# Patient Record
Sex: Female | Born: 1990 | Race: White | Hispanic: No | Smoking: Former smoker
Health system: Southern US, Community
[De-identification: ages and names within clinical notes are randomized; demographics above are authoritative.]

## PROBLEM LIST (undated history)

## (undated) DIAGNOSIS — R3915 Urgency of urination: Secondary | ICD-10-CM

## (undated) DIAGNOSIS — N840 Polyp of corpus uteri: Secondary | ICD-10-CM

## (undated) DIAGNOSIS — Z87898 Personal history of other specified conditions: Secondary | ICD-10-CM

## (undated) DIAGNOSIS — N898 Other specified noninflammatory disorders of vagina: Secondary | ICD-10-CM

## (undated) DIAGNOSIS — F419 Anxiety disorder, unspecified: Secondary | ICD-10-CM

## (undated) DIAGNOSIS — N83202 Unspecified ovarian cyst, left side: Secondary | ICD-10-CM

## (undated) DIAGNOSIS — N920 Excessive and frequent menstruation with regular cycle: Secondary | ICD-10-CM

## (undated) DIAGNOSIS — D509 Iron deficiency anemia, unspecified: Secondary | ICD-10-CM

## (undated) DIAGNOSIS — R519 Headache, unspecified: Secondary | ICD-10-CM

## (undated) DIAGNOSIS — F32A Depression, unspecified: Secondary | ICD-10-CM

## (undated) DIAGNOSIS — F99 Mental disorder, not otherwise specified: Secondary | ICD-10-CM

## (undated) DIAGNOSIS — F988 Other specified behavioral and emotional disorders with onset usually occurring in childhood and adolescence: Secondary | ICD-10-CM

## (undated) DIAGNOSIS — R454 Irritability and anger: Secondary | ICD-10-CM

## (undated) DIAGNOSIS — N83201 Unspecified ovarian cyst, right side: Secondary | ICD-10-CM

## (undated) DIAGNOSIS — L659 Nonscarring hair loss, unspecified: Secondary | ICD-10-CM

## (undated) DIAGNOSIS — N921 Excessive and frequent menstruation with irregular cycle: Secondary | ICD-10-CM

## (undated) DIAGNOSIS — N926 Irregular menstruation, unspecified: Secondary | ICD-10-CM

## (undated) DIAGNOSIS — N941 Unspecified dyspareunia: Secondary | ICD-10-CM

## (undated) HISTORY — DX: Urgency of urination: R39.15

## (undated) HISTORY — DX: Nonscarring hair loss, unspecified: L65.9

## (undated) HISTORY — DX: Unspecified ovarian cyst, right side: N83.201

## (undated) HISTORY — DX: Unspecified ovarian cyst, left side: N83.202

## (undated) HISTORY — DX: Mental disorder, not otherwise specified: F99

## (undated) HISTORY — PX: CHOLECYSTECTOMY: SHX55

## (undated) HISTORY — DX: Unspecified dyspareunia: N94.10

## (undated) HISTORY — DX: Other specified noninflammatory disorders of vagina: N89.8

## (undated) HISTORY — DX: Irritability and anger: R45.4

## (undated) HISTORY — DX: Irregular menstruation, unspecified: N92.6

## (undated) HISTORY — DX: Excessive and frequent menstruation with regular cycle: N92.0

---
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
## 2012-01-20 DIAGNOSIS — F411 Generalized anxiety disorder: Secondary | ICD-10-CM | POA: Insufficient documentation

## 2013-01-31 ENCOUNTER — Encounter: Payer: Self-pay | Admitting: Women's Health

## 2013-01-31 ENCOUNTER — Encounter (INDEPENDENT_AMBULATORY_CARE_PROVIDER_SITE_OTHER): Payer: Self-pay

## 2013-01-31 ENCOUNTER — Ambulatory Visit (INDEPENDENT_AMBULATORY_CARE_PROVIDER_SITE_OTHER): Payer: 59 | Admitting: Women's Health

## 2013-01-31 VITALS — BP 140/70 | Ht 68.0 in | Wt 297.4 lb

## 2013-01-31 DIAGNOSIS — R102 Pelvic and perineal pain: Secondary | ICD-10-CM

## 2013-01-31 DIAGNOSIS — N898 Other specified noninflammatory disorders of vagina: Secondary | ICD-10-CM

## 2013-01-31 DIAGNOSIS — B9689 Other specified bacterial agents as the cause of diseases classified elsewhere: Secondary | ICD-10-CM

## 2013-01-31 DIAGNOSIS — N760 Acute vaginitis: Secondary | ICD-10-CM

## 2013-01-31 DIAGNOSIS — N949 Unspecified condition associated with female genital organs and menstrual cycle: Secondary | ICD-10-CM

## 2013-01-31 DIAGNOSIS — A499 Bacterial infection, unspecified: Secondary | ICD-10-CM

## 2013-01-31 LAB — POCT WET PREP (WET MOUNT): CLUE CELLS WET PREP WHIFF POC: POSITIVE

## 2013-01-31 MED ORDER — METRONIDAZOLE 500 MG PO TABS: 500.0000 mg | ORAL_TABLET | Freq: Two times a day (BID) | ORAL | Status: DC

## 2013-01-31 NOTE — Progress Notes (Signed)
Patient ID: Kayla Wilkins, female   DOB: 12-14-1990, 23 y.o.   MRN: [id number]   [facility]  Patient name: Kayla Wilkins MRN [id number]  Date of birth: 12-14-1990  CC & HPI:  Kayla Wilkins is a 23 y.o. G0P0 Caucasian female presenting today as a new patient to our office having moved here from [place], for report of green malodorous d/c x 1 wk w/ some vaginal itching. She also has a Mirena that was [place] in Dec 2014 [place] and thinks it may be causing her some problems. She has been having occ sharp pelvic pains, becoming more frequent, w/ lengthening and shortening of IUD strings. She had IUD placed 2/2 menorrhagia. She denies h/o HTN, doesn't have a PCP, but states her bp's have always been elevated when going to doctor. She thinks her last pap was >1557yrs ago and was abnormal but doesn't remember exactly what it was.   Pertinent History Reviewed:  Medical & Surgical Hx:   Past Medical History  Diagnosis Date  . Menorrhagia   . Bilateral ovarian cysts    Past Surgical History  Procedure Laterality Date  . Cholecystectomy     Medications: Reviewed & Updated - see associated section Social History: Reviewed -  reports that she has quit smoking. Her smoking use included Cigarettes. She smoked 0.00 packs per day. She has never used smokeless tobacco.  Objective Findings:  Vitals: BP 140/70  Ht 5\' 8"  (1.727 m)  Wt 297 lb 6.4 oz (134.9 kg)  BMI 45.23 kg/m2  LMP 01/22/2013 BP recheck: 130/80 Physical Examination: General appearance - alert, well appearing, and in no distress Pelvic - VULVA: normal appearing vulva with no masses, tenderness or lesions, VAGINA: vaginal discharge - bloody and malodorous, CERVIX: normal appearing cervix without discharge or lesions, Mirena IUD strings visible, UTERUS: uterus is normal size, shape, consistency and nontender, ADNEXA: normal adnexa in size, nontender and no masses, WET MOUNT done - results: clue  cells  Assessment & Plan:  A:   BV  Pelvic Pain w/ Mirena IUD  Initally elevated bp  Needs pap & physical P:  F/U 1wk for pelvic u/s and pap & physical  Rx metronidazole 500mg  BID x 7d, no etoh  Establish care w/ PCP as well   Marge DuncansBooker, Kimberly Randall CNM, WHNP-BC 01/31/2013 9:43 AM

## 2013-01-31 NOTE — Patient Instructions (Signed)

## 2013-02-08 ENCOUNTER — Encounter: Payer: Self-pay | Admitting: Women's Health

## 2013-02-08 ENCOUNTER — Ambulatory Visit (INDEPENDENT_AMBULATORY_CARE_PROVIDER_SITE_OTHER): Payer: 59 | Admitting: Women's Health

## 2013-02-08 ENCOUNTER — Other Ambulatory Visit: Payer: Self-pay | Admitting: Women's Health

## 2013-02-08 ENCOUNTER — Ambulatory Visit (INDEPENDENT_AMBULATORY_CARE_PROVIDER_SITE_OTHER): Payer: 59

## 2013-02-08 ENCOUNTER — Other Ambulatory Visit: Payer: 59 | Admitting: Women's Health

## 2013-02-08 VITALS — BP 140/70 | Ht 68.0 in | Wt 294.0 lb

## 2013-02-08 DIAGNOSIS — R102 Pelvic and perineal pain: Secondary | ICD-10-CM

## 2013-02-08 DIAGNOSIS — T839XXA Unspecified complication of genitourinary prosthetic device, implant and graft, initial encounter: Secondary | ICD-10-CM

## 2013-02-08 DIAGNOSIS — N920 Excessive and frequent menstruation with regular cycle: Secondary | ICD-10-CM

## 2013-02-08 DIAGNOSIS — N949 Unspecified condition associated with female genital organs and menstrual cycle: Secondary | ICD-10-CM

## 2013-02-08 DIAGNOSIS — T8332XA Displacement of intrauterine contraceptive device, initial encounter: Secondary | ICD-10-CM

## 2013-02-08 DIAGNOSIS — Z8742 Personal history of other diseases of the female genital tract: Secondary | ICD-10-CM | POA: Insufficient documentation

## 2013-02-08 DIAGNOSIS — Z113 Encounter for screening for infections with a predominantly sexual mode of transmission: Secondary | ICD-10-CM

## 2013-02-08 DIAGNOSIS — Z01419 Encounter for gynecological examination (general) (routine) without abnormal findings: Secondary | ICD-10-CM

## 2013-02-08 DIAGNOSIS — T8389XA Other specified complication of genitourinary prosthetic devices, implants and grafts, initial encounter: Secondary | ICD-10-CM | POA: Insufficient documentation

## 2013-02-08 NOTE — Patient Instructions (Signed)
Nothing to eat or drink after midnight the night before you come for your labs  Levonorgestrel intrauterine device (IUD)- Mirena What is this medicine? LEVONORGESTREL IUD (LEE voe nor jes trel) is a contraceptive (birth control) device. The device is placed inside the uterus by a healthcare professional. It is used to prevent pregnancy and can also be used to treat heavy bleeding that occurs during your period. Depending on the device, it can be used for 3 to 5 years. This medicine may be used for other purposes; ask your health care provider or pharmacist if you have questions. COMMON BRAND NAME(S): Gretta Cool What should I tell my health care provider before I take this medicine? They need to know if you have any of these conditions: -abnormal Pap smear -cancer of the breast, uterus, or cervix -diabetes -endometritis -genital or pelvic infection now or in the past -have more than one sexual partner or your partner has more than one partner -heart disease -history of an ectopic or tubal pregnancy -immune system problems -IUD in place -liver disease or tumor -problems with blood clots or take blood-thinners -use intravenous drugs -uterus of unusual shape -vaginal bleeding that has not been explained -an unusual or allergic reaction to levonorgestrel, other hormones, silicone, or polyethylene, medicines, foods, dyes, or preservatives -pregnant or trying to get pregnant -breast-feeding How should I use this medicine? This device is placed inside the uterus by a health care professional. Talk to your pediatrician regarding the use of this medicine in children. Special care may be needed. Overdosage: If you think you have taken too much of this medicine contact a poison control center or emergency room at once. NOTE: This medicine is only for you. Do not share this medicine with others. What if I miss a dose? This does not apply. What may interact with this medicine? Do not take  this medicine with any of the following medications: -amprenavir -bosentan -fosamprenavir This medicine may also interact with the following medications: -aprepitant -barbiturate medicines for inducing sleep or treating seizures -bexarotene -griseofulvin -medicines to treat seizures like carbamazepine, ethotoin, felbamate, oxcarbazepine, phenytoin, topiramate -modafinil -pioglitazone -rifabutin -rifampin -rifapentine -some medicines to treat HIV infection like atazanavir, indinavir, lopinavir, nelfinavir, tipranavir, ritonavir -St. John's wort -warfarin This list may not describe all possible interactions. Give your health care provider a list of all the medicines, herbs, non-prescription drugs, or dietary supplements you use. Also tell them if you smoke, drink alcohol, or use illegal drugs. Some items may interact with your medicine. What should I watch for while using this medicine? Visit your doctor or health care professional for regular check ups. See your doctor if you or your partner has sexual contact with others, becomes HIV positive, or gets a sexual transmitted disease. This product does not protect you against HIV infection (AIDS) or other sexually transmitted diseases. You can check the placement of the IUD yourself by reaching up to the top of your vagina with clean fingers to feel the threads. Do not pull on the threads. It is a good habit to check placement after each menstrual period. Call your doctor right away if you feel more of the IUD than just the threads or if you cannot feel the threads at all. The IUD may come out by itself. You may become pregnant if the device comes out. If you notice that the IUD has come out use a backup birth control method like condoms and call your health care provider. Using tampons will not change the  position of the IUD and are okay to use during your period. What side effects may I notice from receiving this medicine? Side effects that  you should report to your doctor or health care professional as soon as possible: -allergic reactions like skin rash, itching or hives, swelling of the face, lips, or tongue -fever, flu-like symptoms -genital sores -high blood pressure -no menstrual period for 6 weeks during use -pain, swelling, warmth in the leg -pelvic pain or tenderness -severe or sudden headache -signs of pregnancy -stomach cramping -sudden shortness of breath -trouble with balance, talking, or walking -unusual vaginal bleeding, discharge -yellowing of the eyes or skin Side effects that usually do not require medical attention (report to your doctor or health care professional if they continue or are bothersome): -acne -breast pain -change in sex drive or performance -changes in weight -cramping, dizziness, or faintness while the device is being inserted -headache -irregular menstrual bleeding within first 3 to 6 months of use -nausea This list may not describe all possible side effects. Call your doctor for medical advice about side effects. You may report side effects to FDA at 1-800-FDA-1088. Where should I keep my medicine? This does not apply. NOTE: This sheet is a summary. It may not cover all possible information. If you have questions about this medicine, talk to your doctor, pharmacist, or health care provider.  2014, Elsevier/Gold Standard. (2011-02-05 13:54:04)

## 2013-02-08 NOTE — Progress Notes (Signed)
Patient ID: Kayla Wilkins, female   DOB: August 26, 1990, 23 y.o.   MRN: [id number]   [facility]  Patient name: Kayla Wilkins MRN [id number]  Date of birth: August 26, 1990  CC & HPI:  Kayla SeminoleHeather Wahba is a 23 y.o. Caucasian female presenting today for pelvic u/s d/t pelvic pain w/ Mirena IUD, and scheduled for pap & physical.  Today's u/s reveals multiple cysts Rt ovary, and Mirena IUD located in LUS/Cx. She doesn't have any children and feels that she never wants any children. She had Mirena placed d/t menorrhagia. Very lengthy discussion w/ pt about options, including removing Mirena and replacing w/ a new one, which pt is hesitant to do b/c this is the 2nd Mirena she has had that has migrated and had to be removed. Also discussed COCs-BP is elevated again today, and pt reports h/o occ severe migraines w/ seeing spots. I am hesitant to start her on COCs d/t this. Depo- discussed SE of weight gain, pt w/ current BMI of 44.71 and states she gained 80lb weight gain in the past w/ depo. Megace-pt unsure about.  Ablation-pt is hesitant to do anything permanent such as ablation in case she ever changes her mind about children.  She also would like labs, and is not fasting. She would like to leave this Mirena in place to retain contraception, until she decides what to do. Due to very lengthy conversation, I was not able to perform pap & physical. She will come back for fasting labs tomorrow or Friday and reschedule a pap & physical, IUD removal, w/ me at the beginning of next week. If she develops severe abdominal pain prior to procedure she is to call us or go to [facility] to have Mirena removed.   Objective Findings:  Vitals: BP 140/70  Ht 5\' 8"  (1.727 m)  Wt 294 lb (133.358 kg)  BMI 44.71 kg/m2  LMP 01/22/2013  Today's Pelvic U/S: Uterus 7.1 x 4.7 x 3.8 cm, anteverted uterus no myometrial masses noted  Endometrium 7.5 mm, symmetrical, IUD located in LUS/CX (3.3cm from fundus to top of IUD)   Right ovary 4.5 x 3.6 x 3.3 cm, multiple cysts noted largest =2.2cm  Left ovary 2.8 x 2.4 x 1.5 cm, (visualized best transabdominally)  No free fluid noted  Assessment & Plan:  A:   Malpositioned Mirena IUD  H/O menorrhagia  Elevated bp  P:  Fasting labs today or tomorrow (CBC, CMP, TSH, Lipid profile, A1C)   F/U early next week for pap & physical, IUD removal, and further discussion of options for contraception and menorrhagia management  To buy home BP monitor and check few times daily, bring log when she comes back for visit   Marge DuncansBooker, Darryon Bastin Randall CNM, [facility] 02/08/2013 5:17 PM

## 2013-02-09 ENCOUNTER — Other Ambulatory Visit: Payer: 59

## 2013-02-09 DIAGNOSIS — Z13220 Encounter for screening for lipoid disorders: Secondary | ICD-10-CM

## 2013-02-09 DIAGNOSIS — Z131 Encounter for screening for diabetes mellitus: Secondary | ICD-10-CM

## 2013-02-09 DIAGNOSIS — Z1329 Encounter for screening for other suspected endocrine disorder: Secondary | ICD-10-CM

## 2013-02-09 DIAGNOSIS — Z0000 Encounter for general adult medical examination without abnormal findings: Secondary | ICD-10-CM

## 2013-02-09 LAB — CBC
HCT: 41.5 % (ref 36.0–46.0)
Hemoglobin: 14.1 g/dL (ref 12.0–15.0)
MCH: 32.5 pg (ref 26.0–34.0)
MCHC: 34 g/dL (ref 30.0–36.0)
MCV: 95.6 fL (ref 78.0–100.0)
PLATELETS: 383 10*3/uL (ref 150–400)
RBC: 4.34 MIL/uL (ref 3.87–5.11)
RDW: 13.2 % (ref 11.5–15.5)
WBC: 8.6 10*3/uL (ref 4.0–10.5)

## 2013-02-09 LAB — COMPREHENSIVE METABOLIC PANEL
ALBUMIN: 4.1 g/dL (ref 3.5–5.2)
ALK PHOS: 74 U/L (ref 39–117)
ALT: 29 U/L (ref 0–35)
AST: 27 U/L (ref 0–37)
BILIRUBIN TOTAL: 0.4 mg/dL (ref 0.3–1.2)
BUN: 9 mg/dL (ref 6–23)
CO2: 29 mEq/L (ref 19–32)
CREATININE: 0.77 mg/dL (ref 0.50–1.10)
Calcium: 8.8 mg/dL (ref 8.4–10.5)
Chloride: 103 mEq/L (ref 96–112)
GLUCOSE: 94 mg/dL (ref 70–99)
POTASSIUM: 4.1 meq/L (ref 3.5–5.3)
Sodium: 136 mEq/L (ref 135–145)
Total Protein: 6.6 g/dL (ref 6.0–8.3)

## 2013-02-09 LAB — LIPID PANEL
CHOL/HDL RATIO: 2.9 ratio
Cholesterol: 97 mg/dL (ref 0–200)
HDL: 34 mg/dL — AB (ref >39)
LDL CALC: 51 mg/dL (ref 0–99)
TRIGLYCERIDES: 62 mg/dL (ref <150)
VLDL: 12 mg/dL (ref 0–40)

## 2013-02-09 LAB — GC/CHLAMYDIA PROBE AMP
CT PROBE, AMP APTIMA: NEGATIVE
GC Probe RNA: NEGATIVE

## 2013-02-09 LAB — HEMOGLOBIN A1C
Hgb A1c MFr Bld: 5.5 % (ref <5.7)
Mean Plasma Glucose: 111 mg/dL (ref <117)

## 2013-02-10 LAB — TSH: TSH: 1.121 u[IU]/mL (ref 0.350–4.500)

## 2013-02-13 ENCOUNTER — Encounter: Payer: Self-pay | Admitting: Women's Health

## 2013-02-15 ENCOUNTER — Ambulatory Visit (INDEPENDENT_AMBULATORY_CARE_PROVIDER_SITE_OTHER): Payer: 59 | Admitting: Women's Health

## 2013-02-15 ENCOUNTER — Other Ambulatory Visit (HOSPITAL_COMMUNITY)
Admission: RE | Admit: 2013-02-15 | Discharge: 2013-02-15 | Disposition: A | Discharge status: Home / Self Care | Payer: 59 | Source: Ambulatory Visit | Attending: Obstetrics & Gynecology | Admitting: Obstetrics & Gynecology

## 2013-02-15 ENCOUNTER — Encounter: Payer: Self-pay | Admitting: Women's Health

## 2013-02-15 VITALS — BP 122/82 | Ht 68.0 in | Wt 298.0 lb

## 2013-02-15 DIAGNOSIS — Z01419 Encounter for gynecological examination (general) (routine) without abnormal findings: Secondary | ICD-10-CM | POA: Insufficient documentation

## 2013-02-15 DIAGNOSIS — IMO0001 Reserved for inherently not codable concepts without codable children: Secondary | ICD-10-CM

## 2013-02-15 DIAGNOSIS — Z3009 Encounter for other general counseling and advice on contraception: Secondary | ICD-10-CM

## 2013-02-15 DIAGNOSIS — R030 Elevated blood-pressure reading, without diagnosis of hypertension: Secondary | ICD-10-CM

## 2013-02-15 NOTE — Patient Instructions (Signed)
Please take your blood pressure at home 3 times a day and record in log, and bring with you to your appointment  Norethindrone tablets (contraception)- Micronor What is this medicine? NORETHINDRONE (nor eth IN drone) is an oral contraceptive. The product contains a female hormone known as a progestin. It is used to prevent pregnancy. This medicine may be used for other purposes; ask your health care provider or pharmacist if you have questions. COMMON BRAND NAME(S): Camila, Errin , [place], [place], [place] , [place], Nor-QD, Nora-BE, Ortho Micronor What should I tell my health care provider before I take this medicine? They need to know if you have any of these conditions: -blood vessel disease or blood clots -breast, cervical, or vaginal cancer -diabetes -heart disease -kidney disease -liver disease -mental depression -migraine -seizures -stroke -vaginal bleeding -an unusual or allergic reaction to norethindrone, other medicines, foods, dyes, or preservatives -pregnant or trying to get pregnant -breast-feeding How should I use this medicine? Take this medicine by mouth with a glass of water. You may take it with or without food. Follow the directions on the prescription label. Take this medicine at the same time each day and in the order directed on the package. Do not take your medicine more often than directed. Contact your pediatrician regarding the use of this medicine in children. Special care may be needed. This medicine has been used in female children who have started having menstrual periods. A patient package insert for the product will be given with each prescription and refill. Read this sheet carefully each time. The sheet may change frequently. Overdosage: If you think you have taken too much of this medicine contact a poison control center or emergency room at once. NOTE: This medicine is only for you. Do not share this medicine with others. What if I miss a dose? Try  not to miss a dose. Every time you miss a dose or take a dose late your chance of pregnancy increases. When 1 pill is missed (even if only 3 hours late), take the missed pill as soon as possible and continue taking a pill each day at the regular time (use a back up method of birth control for the next 48 hours). If more than 1 dose is missed, use an additional birth control method for the rest of your pill pack until menses occurs. Contact your health care professional if more than 1 dose has been missed. What may interact with this medicine? Do not take this medicine with any of the following medications: -amprenavir or fosamprenavir -bosentan This medicine may also interact with the following medications: -antibiotics or medicines for infections, especially rifampin, rifabutin, rifapentine, and griseofulvin, and possibly penicillins or tetracyclines -aprepitant -barbiturate medicines, such as phenobarbital -carbamazepine -felbamate -modafinil -oxcarbazepine -phenytoin -ritonavir or other medicines for HIV infection or AIDS -St. John's wort -topiramate This list may not describe all possible interactions. Give your health care provider a list of all the medicines, herbs, non-prescription drugs, or dietary supplements you use. Also tell them if you smoke, drink alcohol, or use illegal drugs. Some items may interact with your medicine. What should I watch for while using this medicine? Visit your doctor or health care professional for regular checks on your progress. You will need a regular breast and pelvic exam and Pap smear while on this medicine. Use an additional method of birth control during the first cycle that you take these tablets. If you have any reason to think you are pregnant, stop taking this medicine  right away and contact your doctor or health care professional. If you are taking this medicine for hormone related problems, it may take several cycles of use to see improvement in  your condition. This medicine does not protect you against HIV infection (AIDS) or any other sexually transmitted diseases. What side effects may I notice from receiving this medicine? Side effects that you should report to your doctor or health care professional as soon as possible: -breast tenderness or discharge -pain in the abdomen, chest, groin or leg -severe headache -skin rash, itching, or hives -sudden shortness of breath -unusually weak or tired -vision or speech problems -yellowing of skin or eyes Side effects that usually do not require medical attention (report to your doctor or health care professional if they continue or are bothersome): -changes in sexual desire -change in menstrual flow -facial hair growth -fluid retention and swelling -headache -irritability -nausea -weight gain or loss This list may not describe all possible side effects. Call your doctor for medical advice about side effects. You may report side effects to FDA at 1-800-FDA-1088. Where should I keep my medicine? Keep out of the reach of children. Store at room temperature between 15 and 30 degrees C (59 and 86 degrees F). Throw away any unused medicine after the expiration date. NOTE: This sheet is a summary. It may not cover all possible information. If you have questions about this medicine, talk to your doctor, pharmacist, or health care provider.  2014, Elsevier/Gold Standard. (2011-09-25 16:41:35)

## 2013-02-15 NOTE — Progress Notes (Signed)
Patient ID: Kayla Wilkins, female   DOB: [birth year], 23 y.o.   MRN: [id number] Subjective:     Kayla Wilkins is a 23 y.o. G0P0 Caucasian  female here for a routine well-woman exam.  Patient's last menstrual period was 01/22/2013.  Current complaints: Occ Lt sided pelvic pain and occ lower pelvic pain. Has known malpositioned Mirena IUD that is located in LUS/cx. She is undecided about what she would like to do as far as pulling it out/starting another contraception. We discussed her options last week. Discussed options again today. She is worried about period management b/c she does have a h/o 1-time episode of severe menorrhagia and that's why she had the Mirena placed, which has worked well for her menorrhagia, but this is the 2nd IUD that has become malpositioned, so a 3rd IUD is likely to do the same. She does have a h/o migraines w/ what sound like aura- she sees flashing spots when she has severe migraines- these occur during the ha. She has also had elevated bp's w/ each visit, which she attributes to being nervous. Denies any h/o being dx w/ HTN. States she has a very stressful job and has to work ~50hrs/wk. She does not have a PCP. I do not feel comfortable starting combined contraception options given migraines w/ aura and possible HTN. Projestin-only methods discussed.  She would like to leave the Mirena IUD in for now, schedule an appointment for next week after she has had time to further research her options. She would like printed info on micronor.  Smoking Status: doesn't smoke Got fasting labs including A1C on 02/09/13, which were normal  The following portions of the patient's history were reviewed and updated as appropriate: allergies, current medications, past family history, past medical history, past social history, past surgical history and problem list.   Gynecologic History Patient's last menstrual period was 01/22/2013. Contraception: IUD-Mirena Last Pap: >56yrs ago. Results  were: abnormal Last mammogram: never. Results were: n/a  Obstetric History OB History  No data available    Review of Systems Patient denies any headaches, blurred vision, shortness of breath, chest pain, abdominal pain, problems with bowel movements, urination, or intercourse.      Objective:     Physical Exam  BP 148/60  Ht 5\' 8"  (1.727 m)  Wt 298 lb (135.172 kg)  BMI 45.32 kg/m2  LMP 01/22/2013 BP retake 122/82  General:  Well developed, well nourished, no acute distress. She is alert and oriented x 3. Skin:  Warm and dry Neck:  Midline trachea, no thyromegaly or nodules Cardiovascular: Regular rate and rhythm, no murmur heard Lungs:  Effort normal, all lung fields clear to auscultation bilaterally Breast:  No dominant palpable mass, retraction, or nipple discharge Abdomen:  Soft, non tender, no hepatosplenomegaly or masses Pelvic:  External genitalia is normal in appearance.  The vagina is normal in appearance. The cervix is bulbous, no CMT, Mirena IUD strings are visible. No part of the actual IUD itself is visible.  Thin prep pap is done . Uterus is felt to be normal size, shape, and contour.  No adnexal masses or tenderness noted. Extremities:  No swelling or varicosities noted Psych:  She has a normal mood and affect      Assessment:     Healthy well-woman exam Malpositioned Mirena IUD H/O menorrhagia H/O migraines w/ aura Elevated bp: HTN vs. White coat syndrome Obesity, BMI 45.32 Does not desire pregnancy     Plan:  Gave option of starting antihypertensive  today vs. taking bp's at home and bringing to next appt- she decided to begin taking bp at home 3x/d,   bring log to appt w/ her to determine HTN vs. White coat syndrome Gave her option of seeing neurologist to definitively determine if she has aura w/ migraines, to open up her options to combined contraception She would like to schedule F/U 1wk for anticipated mirena removal and initiation of new  projestin-only contraception Printed info on micronor given per request Mammogram 25yo or sooner if problems Colonoscopy 25yo or sooner if problems   Marge DuncansBooker, Esbeydi Manago Randall CNM, [facility]NP[facility]BC 02/15/2013 3:45 PM

## 2013-02-21 ENCOUNTER — Encounter: Payer: Self-pay | Admitting: Women's Health

## 2013-02-22 ENCOUNTER — Ambulatory Visit: Payer: 59 | Admitting: Women's Health

## 2013-06-02 ENCOUNTER — Ambulatory Visit (INDEPENDENT_AMBULATORY_CARE_PROVIDER_SITE_OTHER): Payer: 59 | Admitting: Obstetrics & Gynecology

## 2013-06-02 ENCOUNTER — Encounter: Payer: Self-pay | Admitting: Obstetrics & Gynecology

## 2013-06-02 VITALS — BP 120/70 | Ht 66.2 in | Wt 290.0 lb

## 2013-06-02 DIAGNOSIS — N760 Acute vaginitis: Secondary | ICD-10-CM

## 2013-06-02 DIAGNOSIS — A499 Bacterial infection, unspecified: Secondary | ICD-10-CM

## 2013-06-02 DIAGNOSIS — B9689 Other specified bacterial agents as the cause of diseases classified elsewhere: Secondary | ICD-10-CM

## 2013-06-02 MED ORDER — METRONIDAZOLE 0.75 % VA GEL: VAGINAL | Status: DC

## 2013-06-02 MED ORDER — DOXYCYCLINE HYCLATE 50 MG PO CAPS: 100.0000 mg | ORAL_CAPSULE | Freq: Two times a day (BID) | ORAL | Status: DC

## 2013-06-02 NOTE — Progress Notes (Signed)
Patient ID: Kayla Wilkins, female   DOB: 1990-05-05, 23 y.o.   MRN: [id number] Chief Complaint  Patient presents with  . GYN VISIT    VAGINAL DISCHARGE.   2 day history of burning itching yellow vaginal discharge Has had BV 1 time before No bleeding No uti sx No GI sx  Exam Wet prep  findings +bv and +heavy WBC infiltrate, no yeast no trichomonas  Imp Mixed vaginitis  Plan Metro gel qhs x 5 Doxycycline 100 mg bid  Past Medical History  Diagnosis Date  . Menorrhagia   . Bilateral ovarian cysts     Past Surgical History  Procedure Laterality Date  . Cholecystectomy      OB History   Grav Para Term Preterm Abortions TAB SAB Ect Mult Living                  No Known Allergies  History   Social History  . Marital Status: Married    Spouse Name: N/A    Number of Children: N/A  . Years of Education: N/A   Social History Main Topics  . Smoking status: Former Smoker    Types: Cigarettes  . Smokeless tobacco: Never Used  . Alcohol Use: Yes     Comment: 3 times a week  . Drug Use: No  . Sexual Activity: Yes    Birth Control/ Protection: IUD   Other Topics Concern  . None   Social History Narrative  . None    Family History  Problem Relation Age of Onset  . Varicose Veins Mother   . Diabetes Paternal Aunt   . Arthritis Maternal Grandmother   . Cancer Maternal Grandfather     lung

## 2013-06-16 ENCOUNTER — Ambulatory Visit: Payer: 59 | Admitting: Obstetrics & Gynecology

## 2013-08-30 ENCOUNTER — Encounter: Payer: Self-pay | Admitting: Obstetrics & Gynecology

## 2013-08-30 ENCOUNTER — Ambulatory Visit (INDEPENDENT_AMBULATORY_CARE_PROVIDER_SITE_OTHER): Payer: 59 | Admitting: Obstetrics & Gynecology

## 2013-08-30 VITALS — BP 120/70 | Wt 282.4 lb

## 2013-08-30 DIAGNOSIS — N949 Unspecified condition associated with female genital organs and menstrual cycle: Secondary | ICD-10-CM

## 2013-08-30 DIAGNOSIS — R102 Pelvic and perineal pain: Secondary | ICD-10-CM

## 2013-08-30 NOTE — Progress Notes (Signed)
Patient ID: Kayla Wilkins, female   DOB: [birth year], 23 y.o.   MRN: [id number] Pt with 2-3 year history of on again off again pelvic discomfort Had IUD that long Improved on a course of doxycycline but improvement lasted about 5-6 weeks  Blood pressure 120/70, weight 282 lb 6.4 oz (128.096 kg), last menstrual period 08/13/2013.  Exam Abdomen soft not abdominal wall pain NEFG Blood in vault vagina without discharge Cervix normal Uterus NSSC no CMT ovries non tender normal size  Impression Pelvic pain secondary to IUD probably  No evidnece of infection  Sonogram and then follow up

## 2013-09-07 ENCOUNTER — Ambulatory Visit (INDEPENDENT_AMBULATORY_CARE_PROVIDER_SITE_OTHER): Payer: 59 | Admitting: Obstetrics & Gynecology

## 2013-09-07 ENCOUNTER — Encounter: Payer: 59 | Admitting: Obstetrics & Gynecology

## 2013-09-07 ENCOUNTER — Other Ambulatory Visit: Payer: Self-pay | Admitting: Obstetrics & Gynecology

## 2013-09-07 ENCOUNTER — Encounter: Payer: Self-pay | Admitting: Obstetrics & Gynecology

## 2013-09-07 ENCOUNTER — Ambulatory Visit (INDEPENDENT_AMBULATORY_CARE_PROVIDER_SITE_OTHER): Payer: 59

## 2013-09-07 VITALS — BP 110/70 | Wt 278.0 lb

## 2013-09-07 DIAGNOSIS — T8332XD Displacement of intrauterine contraceptive device, subsequent encounter: Secondary | ICD-10-CM

## 2013-09-07 DIAGNOSIS — R102 Pelvic and perineal pain: Secondary | ICD-10-CM

## 2013-09-07 DIAGNOSIS — N949 Unspecified condition associated with female genital organs and menstrual cycle: Secondary | ICD-10-CM

## 2013-09-07 DIAGNOSIS — Z5189 Encounter for other specified aftercare: Secondary | ICD-10-CM

## 2013-09-07 DIAGNOSIS — T8332XS Displacement of intrauterine contraceptive device, sequela: Secondary | ICD-10-CM

## 2013-09-07 DIAGNOSIS — T8389XA Other specified complication of genitourinary prosthetic devices, implants and grafts, initial encounter: Secondary | ICD-10-CM

## 2013-09-07 DIAGNOSIS — Z30432 Encounter for removal of intrauterine contraceptive device: Secondary | ICD-10-CM

## 2013-09-07 MED ORDER — DESOGESTREL-ETHINYL ESTRADIOL 0.15-30 MG-MCG PO TABS: 1.0000 | ORAL_TABLET | Freq: Every day | ORAL | Status: DC

## 2013-09-07 NOTE — Progress Notes (Signed)
Patient ID: Kayla SeminoleHeather Wilkins, female   DOB: 1990-08-09, 23 y.o.   MRN: [id number] Pt continues to have irregular bleeding cervicitis/endometritis etc with malpositioned IUD Removed without difficulty  Will start desogestrel OCP  Follow up prn

## 2013-12-13 ENCOUNTER — Telehealth: Payer: Self-pay | Admitting: Obstetrics and Gynecology

## 2013-12-13 NOTE — Telephone Encounter (Signed)
Pt states that she is about 3 weeks into her Rx and spotted two days for about an hour. Pt has had a negative UPT, pt has had really intense cramping and large blood clots. LMP 11/21/13. Pt is taking Reclipsen.   I spoke with Dr. Emelda FearFerguson about above and he advised that it al sounds normal. I advised the pt that if anything changes or gets worse, call our office back and we could get her worked in.   Pt verbalized understanding.

## 2014-02-06 ENCOUNTER — Ambulatory Visit (INDEPENDENT_AMBULATORY_CARE_PROVIDER_SITE_OTHER): Admitting: Obstetrics & Gynecology

## 2014-02-06 ENCOUNTER — Encounter: Payer: Self-pay | Admitting: Obstetrics & Gynecology

## 2014-02-06 VITALS — BP 122/80 | Wt 286.0 lb

## 2014-02-06 DIAGNOSIS — N760 Acute vaginitis: Secondary | ICD-10-CM

## 2014-02-06 DIAGNOSIS — A499 Bacterial infection, unspecified: Secondary | ICD-10-CM

## 2014-02-06 DIAGNOSIS — B9689 Other specified bacterial agents as the cause of diseases classified elsewhere: Secondary | ICD-10-CM

## 2014-02-06 MED ORDER — METRONIDAZOLE 0.75 % VA GEL: VAGINAL | Status: DC

## 2014-02-06 NOTE — Progress Notes (Signed)
Patient ID: Kayla Wilkins, female   DOB: 04-27-1990, 24 y.o.   MRN: [id number] Chief Complaint  Patient presents with  . gyn visit    vaginal discharge/ white and gray in color.    HPI Pt's husband came home from deployment and had sex "7 or 8" times Then began having vaginal discharge  ROS No burning with urination, frequency or urgency No nausea, vomiting or diarrhea Nor fever chills or other constitutional symptoms   Blood pressure 122/80, weight 286 lb (129.729 kg), last menstrual period 01/24/2014.  EXAM Abdomen:       Vulva:            normal appearing vulva with no masses, tenderness or lesions Vagina:          normal mucosa, thin grey discharge Cervix:           normal appearance Uterus:           Adnexa:          Rectal:            Hemoccult:       Wet prep:  + clue cells light WBC no yeast or trich                          Assessment/Plan:  BV: metro gel x 5 days

## 2014-05-09 ENCOUNTER — Encounter: Payer: Self-pay | Admitting: Obstetrics & Gynecology

## 2014-05-09 ENCOUNTER — Other Ambulatory Visit (HOSPITAL_COMMUNITY)
Admission: RE | Admit: 2014-05-09 | Discharge: 2014-05-09 | Disposition: A | Discharge status: Home / Self Care | Source: Ambulatory Visit | Attending: Obstetrics & Gynecology | Admitting: Obstetrics & Gynecology

## 2014-05-09 ENCOUNTER — Ambulatory Visit (INDEPENDENT_AMBULATORY_CARE_PROVIDER_SITE_OTHER): Admitting: Obstetrics & Gynecology

## 2014-05-09 VITALS — BP 120/80 | HR 76 | Ht 68.0 in | Wt 283.0 lb

## 2014-05-09 DIAGNOSIS — N920 Excessive and frequent menstruation with regular cycle: Secondary | ICD-10-CM | POA: Insufficient documentation

## 2014-05-09 DIAGNOSIS — Z79899 Other long term (current) drug therapy: Secondary | ICD-10-CM | POA: Insufficient documentation

## 2014-05-09 DIAGNOSIS — N8320 Unspecified ovarian cysts: Secondary | ICD-10-CM | POA: Insufficient documentation

## 2014-05-09 DIAGNOSIS — Z01419 Encounter for gynecological examination (general) (routine) without abnormal findings: Secondary | ICD-10-CM | POA: Diagnosis not present

## 2014-05-09 DIAGNOSIS — Z87891 Personal history of nicotine dependence: Secondary | ICD-10-CM | POA: Diagnosis not present

## 2014-05-09 MED ORDER — DESOGESTREL-ETHINYL ESTRADIOL 0.15-30 MG-MCG PO TABS: 1.0000 | ORAL_TABLET | Freq: Every day | ORAL | Status: DC

## 2014-05-09 NOTE — Progress Notes (Signed)
Patient ID: Kayla Wilkins, female   DOB: 04-28-1990, 24 y.o.   MRN: [id number] Subjective:     Kayla Wilkins is a 24 y.o. female here for a routine exam.  Patient's last menstrual period was 04/15/2014. No obstetric history on file. Birth Control Method:  OCP regular Menstrual Calendar(currently): regular  Current complaints: none.   Current acute medical issues:  none   Recent Gynecologic History Patient's last menstrual period was 04/15/2014. Last Pap: 2015 ,  normal Last mammogram: ,    Past Medical History  Diagnosis Date  . Menorrhagia   . Bilateral ovarian cysts     Past Surgical History  Procedure Laterality Date  . Cholecystectomy      OB History    No data available      History   Social History  . Marital Status: Married    Spouse Name: N/A  . Number of Children: N/A  . Years of Education: N/A   Social History Main Topics  . Smoking status: Former Smoker    Types: Cigarettes  . Smokeless tobacco: Never Used  . Alcohol Use: Yes     Comment: 3 times a week  . Drug Use: No  . Sexual Activity: Yes    Birth Control/ Protection: IUD   Other Topics Concern  . None   Social History Narrative    Family History  Problem Relation Age of Onset  . Varicose Veins Mother   . Diabetes Paternal Aunt   . Arthritis Maternal Grandmother   . Cancer Maternal Grandfather     lung     Current outpatient prescriptions:  .  desogestrel-ethinyl estradiol (APRI,EMOQUETTE,SOLIA) 0.15-30 MG-MCG tablet, Take 1 tablet by mouth daily., Disp: 1 Package, Rfl: 11 .  sertraline (ZOLOFT) 50 MG tablet, Take 50 mg by mouth daily., Disp: , Rfl:  .  doxycycline (VIBRAMYCIN) 50 MG capsule, Take 2 capsules (100 mg total) by mouth 2 (two) times daily. (Patient not taking: Reported on 02/06/2014), Disp: 20 capsule, Rfl: 0 .  ibuprofen (ADVIL,MOTRIN) 400 MG tablet, Take 400 mg by mouth every 6 (six) hours as needed., Disp: , Rfl:  .  levonorgestrel (MIRENA) 20 MCG/24HR IUD, 1 each  by Intrauterine route once., Disp: , Rfl:  .  metroNIDAZOLE (FLAGYL) 500 MG tablet, Take 1 tablet (500 mg total) by mouth 2 (two) times daily. X 7 days (Patient not taking: Reported on 02/06/2014), Disp: 14 tablet, Rfl: 0 .  metroNIDAZOLE (METROGEL VAGINAL) 0.75 % vaginal gel, Nightly x 5 nights, Disp: 70 g, Rfl: 0 .  prazosin (MINIPRESS) 5 MG capsule, Take 5 mg by mouth at bedtime., Disp: , Rfl:   Review of Systems  Review of Systems  Constitutional: Negative for fever, chills, weight loss, malaise/fatigue and diaphoresis.  HENT: Negative for hearing loss, ear pain, nosebleeds, congestion, sore throat, neck pain, tinnitus and ear discharge.   Eyes: Negative for blurred vision, double vision, photophobia, pain, discharge and redness.  Respiratory: Negative for cough, hemoptysis, sputum production, shortness of breath, wheezing and stridor.   Cardiovascular: Negative for chest pain, palpitations, orthopnea, claudication, leg swelling and PND.  Gastrointestinal: negative for abdominal pain. Negative for heartburn, nausea, vomiting, diarrhea, constipation, blood in stool and melena.  Genitourinary: Negative for dysuria, urgency, frequency, hematuria and flank pain.  Musculoskeletal: Negative for myalgias, back pain, joint pain and falls.  Skin: Negative for itching and rash.  Neurological: Negative for dizziness, tingling, tremors, sensory change, speech change, focal weakness, seizures, loss of consciousness, weakness and headaches.  Endo/Heme/Allergies:  Negative for environmental allergies and polydipsia. Does not bruise/bleed easily.  Psychiatric/Behavioral: Negative for depression, suicidal ideas, hallucinations, memory loss and substance abuse. The patient is not nervous/anxious and does not have insomnia.        Objective:  Blood pressure 120/80, pulse 76, height 5\' 8"  (1.727 m), weight 283 lb (128.368 kg), last menstrual period 04/15/2014.   Physical Exam  Vitals  reviewed. Constitutional: She is oriented to person, place, and time. She appears well-developed and well-nourished.  HENT:  Head: Normocephalic and atraumatic.        Right Ear: External ear normal.  Left Ear: External ear normal.  Nose: Nose normal.  Mouth/Throat: Oropharynx is clear and moist.  Eyes: Conjunctivae and EOM are normal. Pupils are equal, round, and reactive to light. Right eye exhibits no discharge. Left eye exhibits no discharge. No scleral icterus.  Neck: Normal range of motion. Neck supple. No tracheal deviation present. No thyromegaly present.  Cardiovascular: Normal rate, regular rhythm, normal heart sounds and intact distal pulses.  Exam reveals no gallop and no friction rub.   No murmur heard. Respiratory: Effort normal and breath sounds normal. No respiratory distress. She has no wheezes. She has no rales. She exhibits no tenderness.  GI: Soft. Bowel sounds are normal. She exhibits no distension and no mass. There is no tenderness. There is no rebound and no guarding.  Genitourinary:  Breasts no masses skin changes or nipple changes bilaterally      Vulva is normal without lesions Vagina is pink moist without discharge Cervix normal in appearance and pap is done Uterus is normal size shape and contour Adnexa is negative with normal sized ovaries   Musculoskeletal: Normal range of motion. She exhibits no edema and no tenderness.  Neurological: She is alert and oriented to person, place, and time. She has normal reflexes. She displays normal reflexes. No cranial nerve deficit. She exhibits normal muscle tone. Coordination normal.  Skin: Skin is warm and dry. No rash noted. No erythema. No pallor.  Psychiatric: She has a normal mood and affect. Her behavior is normal. Judgment and thought content normal.       Assessment:    Healthy female exam.    Plan:    Contraception: OCP (estrogen/progesterone). Follow up in: 1 years.

## 2014-05-10 ENCOUNTER — Other Ambulatory Visit: Admitting: Obstetrics & Gynecology

## 2014-05-11 LAB — CYTOLOGY - PAP

## 2014-06-26 DIAGNOSIS — G8929 Other chronic pain: Secondary | ICD-10-CM | POA: Insufficient documentation

## 2014-06-26 DIAGNOSIS — M545 Low back pain, unspecified: Secondary | ICD-10-CM | POA: Insufficient documentation

## 2014-06-26 DIAGNOSIS — M25561 Pain in right knee: Secondary | ICD-10-CM | POA: Insufficient documentation

## 2014-07-16 ENCOUNTER — Telehealth: Payer: Self-pay | Admitting: Obstetrics & Gynecology

## 2014-07-16 DIAGNOSIS — Z1329 Encounter for screening for other suspected endocrine disorder: Secondary | ICD-10-CM

## 2014-07-16 NOTE — Telephone Encounter (Signed)
Pt requesting when she last had her last blood work done for thyroid and results. Pt informed TSH 1.121 on 02/09/2013.   Pt requesting to have blood work done to check her Thyroid. Order placed and pt informed [facility] located across the hall and hours of operation.

## 2014-09-01 ENCOUNTER — Other Ambulatory Visit: Payer: Self-pay | Admitting: Obstetrics & Gynecology

## 2014-10-08 ENCOUNTER — Other Ambulatory Visit: Payer: Self-pay | Admitting: Obstetrics & Gynecology

## 2014-10-08 ENCOUNTER — Telehealth: Payer: Self-pay | Admitting: Obstetrics & Gynecology

## 2014-10-08 NOTE — Telephone Encounter (Signed)
Pt taking Juleber for OCP, would like to change her birth control pill to something else due to "no sex drive." Please advise.

## 2014-10-09 ENCOUNTER — Telehealth: Payer: Self-pay | Admitting: Obstetrics & Gynecology

## 2014-10-09 MED ORDER — NORGESTIMATE-ETH ESTRADIOL 0.25-35 MG-MCG PO TABS: 1.0000 | ORAL_TABLET | Freq: Every day | ORAL | Status: DC

## 2014-10-12 ENCOUNTER — Telehealth: Payer: Self-pay | Admitting: Obstetrics & Gynecology

## 2014-10-18 NOTE — Telephone Encounter (Signed)
Pt informed Dr. Wilmer Floor Sprintec on 10/09/2014, Pt c/o an abnormal odor with her period but not on her period now. Pt advised to call when symptoms are noted and will try to work the pt in with a provider. All questions in regards to new birth control pill answered and pt verbalized understanding.

## 2015-01-14 DIAGNOSIS — Z903 Acquired absence of stomach [part of]: Secondary | ICD-10-CM

## 2015-01-14 HISTORY — DX: Acquired absence of stomach (part of): Z90.3

## 2015-01-14 HISTORY — PX: LAPAROSCOPIC GASTRIC SLEEVE RESECTION: SHX5895

## 2015-01-14 HISTORY — PX: OTHER SURGICAL HISTORY: SHX169

## 2015-01-20 DIAGNOSIS — G935 Compression of brain: Secondary | ICD-10-CM

## 2015-01-20 HISTORY — DX: Compression of brain: G93.5

## 2015-02-21 DIAGNOSIS — Z9884 Bariatric surgery status: Secondary | ICD-10-CM | POA: Insufficient documentation

## 2015-05-13 ENCOUNTER — Other Ambulatory Visit: Admitting: Obstetrics & Gynecology

## 2015-05-13 ENCOUNTER — Encounter: Payer: Self-pay | Admitting: Obstetrics & Gynecology

## 2015-07-29 DIAGNOSIS — M5136 Other intervertebral disc degeneration, lumbar region: Secondary | ICD-10-CM | POA: Insufficient documentation

## 2015-07-29 DIAGNOSIS — M5144 Schmorl's nodes, thoracic region: Secondary | ICD-10-CM | POA: Insufficient documentation

## 2015-08-15 ENCOUNTER — Ambulatory Visit: Admitting: Adult Health

## 2015-08-21 ENCOUNTER — Ambulatory Visit (INDEPENDENT_AMBULATORY_CARE_PROVIDER_SITE_OTHER): Admitting: Adult Health

## 2015-08-21 ENCOUNTER — Encounter: Payer: Self-pay | Admitting: Adult Health

## 2015-08-21 VITALS — BP 108/72 | HR 76 | Ht 68.0 in | Wt 180.5 lb

## 2015-08-21 DIAGNOSIS — N926 Irregular menstruation, unspecified: Secondary | ICD-10-CM

## 2015-08-21 DIAGNOSIS — N898 Other specified noninflammatory disorders of vagina: Secondary | ICD-10-CM | POA: Diagnosis not present

## 2015-08-21 DIAGNOSIS — R454 Irritability and anger: Secondary | ICD-10-CM | POA: Diagnosis not present

## 2015-08-21 DIAGNOSIS — N941 Unspecified dyspareunia: Secondary | ICD-10-CM

## 2015-08-21 DIAGNOSIS — Z3202 Encounter for pregnancy test, result negative: Secondary | ICD-10-CM | POA: Diagnosis not present

## 2015-08-21 DIAGNOSIS — R3915 Urgency of urination: Secondary | ICD-10-CM | POA: Diagnosis not present

## 2015-08-21 DIAGNOSIS — L659 Nonscarring hair loss, unspecified: Secondary | ICD-10-CM | POA: Diagnosis not present

## 2015-08-21 HISTORY — DX: Irritability and anger: R45.4

## 2015-08-21 HISTORY — DX: Nonscarring hair loss, unspecified: L65.9

## 2015-08-21 HISTORY — DX: Unspecified dyspareunia: N94.10

## 2015-08-21 HISTORY — DX: Irregular menstruation, unspecified: N92.6

## 2015-08-21 HISTORY — DX: Urgency of urination: R39.15

## 2015-08-21 HISTORY — DX: Other specified noninflammatory disorders of vagina: N89.8

## 2015-08-21 LAB — POCT URINALYSIS DIPSTICK
GLUCOSE UA: NEGATIVE
Leukocytes, UA: NEGATIVE
Nitrite, UA: NEGATIVE
Protein, UA: NEGATIVE

## 2015-08-21 LAB — POCT WET PREP (WET MOUNT): CLUE CELLS WET PREP WHIFF POC: NEGATIVE

## 2015-08-21 LAB — POCT URINE PREGNANCY: Preg Test, Ur: NEGATIVE

## 2015-08-21 NOTE — Patient Instructions (Addendum)
Keep period calendar,if wants OCs let us know  Follow up prn  See dermatologist  Talk with psychiatrist

## 2015-08-21 NOTE — Progress Notes (Signed)
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   Review of Systems Patient denies any daily headaches, hearing loss, fatigue, blurred vision, shortness of breath, chest pain, abdominal pain, problems with bowel movements.  No joint pain or mood swings.She HPI for positives.  Reviewed past medical,surgical, social and family history. Reviewed medications and allergies.     Objective:   Physical Exam BP 108/72 (BP Location: Left Arm, Patient Position: Sitting, Cuff Size: Normal)   Pulse 76   Ht 5\' 8"  (1.727 m)   Wt 180 lb 8 oz (81.9 kg)   LMP 08/21/2015   BMI 27.44 kg/m urine 3+ blood UPT negative, Skin warm and dry, eye brows are thin, Pelvic: external genitalia is normal in appearance no lesions, vagina: period like blood,urethra has no lesions or masses noted, cervix:smooth and nontender, uterus: normal size, shape and contour, non tender, no masses felt, adnexa: no masses or tenderness noted. Bladder is non tender and no masses felt. Wet prep: +RBC GC/CHL obtained.    TSH 1.43 and free T4 1.37 in June. Discussed seeing dermatologist for hair, try washing eye brows with baby shampoo, and talk with Dr Mila Homer about irritability and keep period calendar, can always try low dose OCs.Make sure getting protein and fats in diet.Make sure getting enough water.Change positions with sex and use  lubricate and have sex regularly.Told her, her body and mind needs to adjust to her weight loss.Exercise regularly. Face time 25 minutes with 50% counseling.  Assessment:    Vaginal discharge Urinary frequency UPT negative Irregular periods Dyspareunia Hair loss Irritable       Plan:     GC/CHL sent  UA C&S sent  Keep period calendar, let me know if wants to try OCs  See dermatologist about hair loss Talk with Dr Mila Homer about irritability along with her depression Follow up prn

## 2015-08-22 LAB — GC/CHLAMYDIA PROBE AMP
CHLAMYDIA, DNA PROBE: NEGATIVE
NEISSERIA GONORRHOEAE BY PCR: NEGATIVE

## 2015-08-22 LAB — URINALYSIS, ROUTINE W REFLEX MICROSCOPIC
Bilirubin, UA: NEGATIVE
Glucose, UA: NEGATIVE
Ketones, UA: NEGATIVE
Leukocytes, UA: NEGATIVE
Nitrite, UA: NEGATIVE
Specific Gravity, UA: 1.029 (ref 1.005–1.030)
Urobilinogen, Ur: 0.2 mg/dL (ref 0.2–1.0)
pH, UA: 6 (ref 5.0–7.5)

## 2015-08-22 LAB — MICROSCOPIC EXAMINATION

## 2015-08-23 LAB — URINE CULTURE

## 2015-08-26 ENCOUNTER — Telehealth: Payer: Self-pay | Admitting: Adult Health

## 2015-08-26 MED ORDER — NITROFURANTOIN MONOHYD MACRO 100 MG PO CAPS: 100.0000 mg | ORAL_CAPSULE | Freq: Two times a day (BID) | ORAL | 0 refills | Status: DC

## 2015-08-26 NOTE — Telephone Encounter (Signed)
Left message that urine + Ecoli and that I will send rx for macrobid to [facility], push fluids

## 2015-12-31 DIAGNOSIS — F3342 Major depressive disorder, recurrent, in full remission: Secondary | ICD-10-CM | POA: Insufficient documentation

## 2017-07-26 DIAGNOSIS — M222X2 Patellofemoral disorders, left knee: Secondary | ICD-10-CM | POA: Insufficient documentation

## 2017-07-26 DIAGNOSIS — R29898 Other symptoms and signs involving the musculoskeletal system: Secondary | ICD-10-CM | POA: Insufficient documentation

## 2017-07-26 DIAGNOSIS — Z9884 Bariatric surgery status: Secondary | ICD-10-CM | POA: Insufficient documentation

## 2017-07-26 DIAGNOSIS — M7652 Patellar tendinitis, left knee: Secondary | ICD-10-CM | POA: Insufficient documentation

## 2017-07-26 DIAGNOSIS — M7651 Patellar tendinitis, right knee: Secondary | ICD-10-CM | POA: Insufficient documentation

## 2017-08-16 DIAGNOSIS — M25552 Pain in left hip: Secondary | ICD-10-CM | POA: Insufficient documentation

## 2017-08-23 ENCOUNTER — Ambulatory Visit: Admitting: Obstetrics & Gynecology

## 2017-09-27 ENCOUNTER — Other Ambulatory Visit: Admitting: Obstetrics & Gynecology

## 2018-04-03 DIAGNOSIS — F09 Unspecified mental disorder due to known physiological condition: Secondary | ICD-10-CM | POA: Insufficient documentation

## 2018-04-03 DIAGNOSIS — I499 Cardiac arrhythmia, unspecified: Secondary | ICD-10-CM | POA: Insufficient documentation

## 2018-04-03 DIAGNOSIS — G629 Polyneuropathy, unspecified: Secondary | ICD-10-CM | POA: Insufficient documentation

## 2018-04-03 DIAGNOSIS — R32 Unspecified urinary incontinence: Secondary | ICD-10-CM | POA: Insufficient documentation

## 2018-06-03 DIAGNOSIS — R2681 Unsteadiness on feet: Secondary | ICD-10-CM | POA: Insufficient documentation

## 2018-06-03 DIAGNOSIS — M62838 Other muscle spasm: Secondary | ICD-10-CM | POA: Insufficient documentation

## 2018-06-03 DIAGNOSIS — R200 Anesthesia of skin: Secondary | ICD-10-CM | POA: Insufficient documentation

## 2018-07-04 DIAGNOSIS — L409 Psoriasis, unspecified: Secondary | ICD-10-CM | POA: Insufficient documentation

## 2018-07-04 DIAGNOSIS — M549 Dorsalgia, unspecified: Secondary | ICD-10-CM | POA: Insufficient documentation

## 2018-08-03 DIAGNOSIS — L853 Xerosis cutis: Secondary | ICD-10-CM | POA: Insufficient documentation

## 2018-09-07 ENCOUNTER — Ambulatory Visit: Admitting: Family Medicine

## 2018-09-09 ENCOUNTER — Telehealth: Payer: Self-pay

## 2018-09-09 ENCOUNTER — Ambulatory Visit: Admitting: Family Medicine

## 2018-09-09 NOTE — Telephone Encounter (Signed)

## 2018-09-12 ENCOUNTER — Encounter: Payer: Self-pay | Admitting: Family Medicine

## 2018-09-12 ENCOUNTER — Ambulatory Visit: Payer: 59 | Admitting: Family Medicine

## 2018-09-12 ENCOUNTER — Other Ambulatory Visit: Payer: Self-pay

## 2018-09-12 ENCOUNTER — Ambulatory Visit (INDEPENDENT_AMBULATORY_CARE_PROVIDER_SITE_OTHER): Payer: 59

## 2018-09-12 VITALS — BP 110/70 | HR 64 | Ht 68.0 in | Wt 203.0 lb

## 2018-09-12 DIAGNOSIS — M791 Myalgia, unspecified site: Secondary | ICD-10-CM

## 2018-09-12 DIAGNOSIS — M2550 Pain in unspecified joint: Secondary | ICD-10-CM

## 2018-09-12 DIAGNOSIS — M542 Cervicalgia: Secondary | ICD-10-CM

## 2018-09-12 LAB — CBC
HCT: 39.6 % (ref 36.0–46.0)
Hemoglobin: 13.4 g/dL (ref 12.0–15.0)
MCHC: 33.8 g/dL (ref 30.0–36.0)
MCV: 99.5 fl (ref 78.0–100.0)
Platelets: 325 10*3/uL (ref 150.0–400.0)
RBC: 3.98 Mil/uL (ref 3.87–5.11)
RDW: 12.9 % (ref 11.5–15.5)
WBC: 7.7 10*3/uL (ref 4.0–10.5)

## 2018-09-12 LAB — SEDIMENTATION RATE: Sed Rate: 2 mm/hr (ref 0–20)

## 2018-09-12 NOTE — Progress Notes (Addendum)
Established Patient Office Visit  Subjective:  Patient ID: Kayla Wilkins, female    DOB: June 24, 1990  Age: 28 y.o. MRN: [id number]  CC:  Chief Complaint  Patient presents with  . Establish Care    HPI Kayla SeminoleHeather Cocke presents for evaluation of a 2656-month history, since March, history of multiple arthralgias and myalgias.  It seemed to start with pain and stiffness in her neck.  She occasionally has numbness and tingling in her right arm and fingers.  She reports pain and stiffness in her joints and muscles.  She does not correlate this with any time of day.  She has rare headaches that have been nonprogressive.  She denies diplopia.  She describes occasional lightheadedness without a spinning sensation.  There have been no fever chills or weight loss.  There have been no injuries.  She had experienced a brief episode of paresthesias in the left side of her face that resolved spontaneously.  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  She lives alone with her dog.  She is divorced and does not have a significant other at this time.  Her mom has multiple medical issues.  Her father's health history is unknown.  She does not smoke drink alcohol or use illicit drugs.  History of ADHD.  She is more apt to use her Adderall when she is working.  She takes diazepam daily but is more apt to use the 3 pills again when she is working.  She admits to being stressed with COVID like everyone else and laments not being able to exercise like she used to.  She is also experienced some urinary incontinence.   Past Medical History:  Diagnosis Date  . Bilateral ovarian cysts   . Dyspareunia in female 08/21/2015  . Hair loss 08/21/2015  . Irregular periods 08/21/2015  . Irritable 08/21/2015  . Menorrhagia   . Mental disorder   . Urinary urgency 08/21/2015  . Vaginal discharge 08/21/2015    Past Surgical History:  Procedure Laterality Date  . CHOLECYSTECTOMY    . vertical sleeve   01/14/2015    Family History  Problem  Relation Age of Onset  . Varicose Veins Mother   . Hypertension Mother   . Depression Mother   . Anxiety disorder Mother   . Diabetes Paternal Aunt   . Arthritis Maternal Grandmother   . Alzheimer's disease Maternal Grandmother   . Stroke Maternal Grandmother   . Cancer Maternal Grandfather        lung  . Schizophrenia Maternal Grandfather   . Schizophrenia Father   . Multiple sclerosis Maternal Aunt   . Colon cancer Maternal Aunt     Social History   Socioeconomic History  . Marital status: Divorced    Spouse name: Not on file  . Number of children: Not on file  . Years of education: Not on file  . Highest education level: Not on file  Occupational History  . Not on file  Social Needs  . Financial resource strain: Not on file  . Food insecurity    Worry: Not on file    Inability: Not on file  . Transportation needs    Medical: Not on file    Non-medical: Not on file  Tobacco Use  . Smoking status: Former Smoker    Types: Cigarettes  . Smokeless tobacco: Never Used  Substance and Sexual Activity  . Alcohol use: No  . Drug use: No  . Sexual activity: Yes    Birth control/protection: Surgical  Comment: husband had vasectomy  Lifestyle  . Physical activity    Days per week: Not on file    Minutes per session: Not on file  . Stress: Not on file  Relationships  . Social Herbalist on phone: Not on file    Gets together: Not on file    Attends religious service: Not on file    Active member of club or organization: Not on file    Attends meetings of clubs or organizations: Not on file    Relationship status: Not on file  . Intimate partner violence    Fear of current or ex partner: Not on file    Emotionally abused: Not on file    Physically abused: Not on file    Forced sexual activity: Not on file  Other Topics Concern  . Not on file  Social History Narrative  . Not on file    Outpatient Medications Prior to Visit  Medication Sig Dispense  Refill  . amphetamine-dextroamphetamine (ADDERALL) 15 MG tablet Take 15 mg by mouth 3 (three) times daily.    . diazepam (VALIUM) 5 MG tablet Take 1 tablet by mouth 3 (three) times daily.    Marland Kitchen acetaminophen (TYLENOL) 325 MG tablet Take 650 mg by mouth as needed.    Marland Kitchen amphetamine-dextroamphetamine (ADDERALL) 30 MG tablet 30 mg 2 (two) times daily.     Marland Kitchen buPROPion (WELLBUTRIN SR) 100 MG 12 hr tablet Take 100 mg by mouth 2 (two) times daily.     . calcium citrate-vitamin D (CITRACAL+D) 315-200 MG-UNIT tablet Take 1 tablet by mouth 2 (two) times daily.     . diazepam (VALIUM) 5 MG tablet 15 mg a day as needed.    . fexofenadine (ALLEGRA) 180 MG tablet Take 180 mg by mouth daily.    . nitrofurantoin, macrocrystal-monohydrate, (MACROBID) 100 MG capsule Take 1 capsule (100 mg total) by mouth 2 (two) times daily. 14 capsule 0   No facility-administered medications prior to visit.     No Known Allergies  ROS Review of Systems  Constitutional: Positive for fatigue. Negative for chills, diaphoresis, fever and unexpected weight change.  HENT: Negative.   Eyes: Negative for photophobia and visual disturbance.  Respiratory: Negative.   Cardiovascular: Negative.   Gastrointestinal: Negative.   Endocrine: Negative for polyphagia and polyuria.  Genitourinary: Negative for decreased urine volume, difficulty urinating and dysuria.  Musculoskeletal: Positive for arthralgias, back pain, joint swelling, myalgias, neck pain and neck stiffness.  Skin: Negative for pallor and rash.  Allergic/Immunologic: Negative for immunocompromised state.  Neurological: Positive for light-headedness. Negative for dizziness, seizures, speech difficulty, weakness, numbness and headaches.  Hematological: Does not bruise/bleed easily.      Objective:    Physical Exam  Constitutional: She is oriented to person, place, and time. She appears well-developed and well-nourished. No distress.  HENT:  Head: Normocephalic and  atraumatic.  Right Ear: External ear normal.  Left Ear: External ear normal.  Nose: Nose normal.  Mouth/Throat: Oropharynx is clear and moist. No oropharyngeal exudate.  Eyes: Pupils are equal, round, and reactive to light. Conjunctivae are normal. Right eye exhibits no discharge. Left eye exhibits no discharge. No scleral icterus.  Neck: Neck supple. No JVD present. No tracheal deviation present. No thyromegaly present.  Cardiovascular: Normal rate, regular rhythm and normal heart sounds.  Pulmonary/Chest: Effort normal and breath sounds normal. No stridor.  Abdominal: Bowel sounds are normal.  Musculoskeletal:     Right shoulder: She exhibits normal  range of motion, no tenderness, no bony tenderness, no deformity, no laceration and no pain.     Cervical back: She exhibits bony tenderness. She exhibits normal range of motion, no tenderness, no swelling, no deformity and no spasm.     Thoracic back: She exhibits normal range of motion, no tenderness and no bony tenderness.     Lumbar back: She exhibits normal range of motion, no tenderness and no bony tenderness.       Back:  Lymphadenopathy:    She has no cervical adenopathy.  Neurological: She is alert and oriented to person, place, and time. She has normal strength.  Reflex Scores:      Tricep reflexes are 1+ on the right side and 1+ on the left side.      Bicep reflexes are 2+ on the right side and 2+ on the left side.      Brachioradialis reflexes are 2+ on the right side and 2+ on the left side.      Patellar reflexes are 1+ on the right side and 1+ on the left side.      Achilles reflexes are 1+ on the right side and 1+ on the left side. Negative slr.   Skin: She is not diaphoretic.    BP 110/70   Pulse 64   Ht 5\' 8"  (1.727 m)   Wt 203 lb (92.1 kg)   SpO2 96%   BMI 30.87 kg/m  Wt Readings from Last 3 Encounters:  09/12/18 203 lb (92.1 kg)  08/21/15 180 lb 8 oz (81.9 kg)  05/09/14 283 lb (128.4 kg)   BP Readings from  Last 3 Encounters:  09/12/18 110/70  08/21/15 108/72  05/09/14 120/80   Guideline developer:  UpToDate (see UpToDate for funding source) Date Released: June 2014  Health Maintenance Due  Topic Date Due  . HIV Screening  07/03/2003  . PAP SMEAR-Modifier  05/08/2017  . INFLUENZA VACCINE  08/20/2018    There are no preventive care reminders to display for this patient.  Lab Results  Component Value Date   TSH 1.121 02/09/2013   Lab Results  Component Value Date   WBC 7.7 09/12/2018   HGB 13.4 09/12/2018   HCT 39.6 09/12/2018   MCV 99.5 09/12/2018   PLT 325.0 09/12/2018   Lab Results  Component Value Date   NA 139 09/12/2018   K 4.1 09/12/2018   CO2 25 09/12/2018   GLUCOSE 95 09/12/2018   BUN 14 09/12/2018   CREATININE 0.69 09/12/2018   BILITOT 0.4 09/12/2018   ALKPHOS 42 09/12/2018   AST 19 09/12/2018   ALT 16 09/12/2018   PROT 6.5 09/12/2018   ALBUMIN 4.3 09/12/2018   CALCIUM 9.0 09/12/2018   GFR 99.76 09/12/2018   Lab Results  Component Value Date   CHOL 97 02/09/2013   Lab Results  Component Value Date   HDL 34 (L) 02/09/2013   Lab Results  Component Value Date   LDLCALC 51 02/09/2013   Lab Results  Component Value Date   TRIG 62 02/09/2013   Lab Results  Component Value Date   CHOLHDL 2.9 02/09/2013   Lab Results  Component Value Date   HGBA1C 5.5 02/09/2013      Assessment & Plan:   Problem List Items Addressed This Visit    None    Visit Diagnoses    Myalgia    -  Primary   Relevant Orders   CBC (Completed)   Comprehensive metabolic panel (Completed)  Sedimentation rate (Completed)   C-reactive protein (Completed)   Rheumatoid Factor (Completed)   Urinalysis, Routine w reflex microscopic (Completed)   Ambulatory referral to Sports Medicine   Arthralgia, unspecified joint       Relevant Orders   CBC (Completed)   Comprehensive metabolic panel (Completed)   Sedimentation rate (Completed)   C-reactive protein (Completed)    Rheumatoid Factor (Completed)   Urinalysis, Routine w reflex microscopic (Completed)   Ambulatory referral to Sports Medicine   Cervical pain       Relevant Orders   DG Cervical Spine Complete (Completed)   Rheumatoid Factor (Completed)   Ambulatory referral to Sports Medicine      No orders of the defined types were placed in this encounter.   Follow-up: Return in about 1 month (around 10/13/2018).   8/25 addendum: No evidence for inflammatory arthritis.

## 2018-09-13 LAB — URINALYSIS, ROUTINE W REFLEX MICROSCOPIC
Bilirubin Urine: NEGATIVE
Hgb urine dipstick: NEGATIVE
Ketones, ur: NEGATIVE
Leukocytes,Ua: NEGATIVE
Nitrite: NEGATIVE
RBC / HPF: NONE SEEN (ref 0–?)
Specific Gravity, Urine: 1.025 (ref 1.000–1.030)
Total Protein, Urine: NEGATIVE
Urine Glucose: NEGATIVE
Urobilinogen, UA: 0.2 (ref 0.0–1.0)
pH: 6.5 (ref 5.0–8.0)

## 2018-09-13 LAB — COMPREHENSIVE METABOLIC PANEL
ALT: 16 U/L (ref 0–35)
AST: 19 U/L (ref 0–37)
Albumin: 4.3 g/dL (ref 3.5–5.2)
Alkaline Phosphatase: 42 U/L (ref 39–117)
BUN: 14 mg/dL (ref 6–23)
CO2: 25 mEq/L (ref 19–32)
Calcium: 9 mg/dL (ref 8.4–10.5)
Chloride: 107 mEq/L (ref 96–112)
Creatinine, Ser: 0.69 mg/dL (ref 0.40–1.20)
GFR: 99.76 mL/min (ref >60.00)
Glucose, Bld: 95 mg/dL (ref 70–99)
Potassium: 4.1 mEq/L (ref 3.5–5.1)
Sodium: 139 mEq/L (ref 135–145)
Total Bilirubin: 0.4 mg/dL (ref 0.2–1.2)
Total Protein: 6.5 g/dL (ref 6.0–8.3)

## 2018-09-13 LAB — RHEUMATOID FACTOR: Rheumatoid fact SerPl-aCnc: <14 IU/mL (ref <14)

## 2018-09-13 LAB — C-REACTIVE PROTEIN: CRP: <1 mg/dL (ref 0.5–20.0)

## 2018-09-13 NOTE — Addendum Note (Signed)
Addended by: Jon Billings on: 09/13/2018 09:05 AM   Modules accepted: Orders

## 2018-09-16 ENCOUNTER — Encounter: Payer: Self-pay | Admitting: Family Medicine

## 2018-09-16 ENCOUNTER — Ambulatory Visit: Payer: 59 | Admitting: Family Medicine

## 2018-09-16 ENCOUNTER — Other Ambulatory Visit: Payer: Self-pay

## 2018-09-16 VITALS — BP 122/78 | Ht 68.0 in | Wt 209.0 lb

## 2018-09-16 DIAGNOSIS — G935 Compression of brain: Secondary | ICD-10-CM

## 2018-09-16 MED ORDER — PENNSAID 2 % TD SOLN: 1.0000 "application " | Freq: Two times a day (BID) | TRANSDERMAL | 3 refills | Status: DC

## 2018-09-16 NOTE — Progress Notes (Signed)
Kayla Wilkins - 28 y.o. female MRN [id number]  Date of birth: 1990-09-03  SUBJECTIVE:  Including CC & ROS.  Chief Complaint  Patient presents with  . Back Pain    Kayla Wilkins is a 28 y.o. female that is presenting with multiple complaints involving her back, upper extremities, hands, lower extremities and feet.  She reports a history of similar symptoms some years ago.  She is having pain in the upper back and arms and legs.  She does not associate this with any specific exercise.  She also reports abnormal movements and running into walls and lack of coordination.  She has concern that there may be something more going on.  She has a history of a Chiari malformation.  Reports some numbness and different altered sensations throughout her hands and feet.  Has concern as her aunt has multiple sclerosis.  She was seen by neurology in 2017 for similar symptoms.  At that time the neurologist felt that her symptoms were attributed to stress and anxiety.  She had had a Chiari malformation found with previous imaging.  Independent review of the cervical spine x-ray from 8/24 shows no acute abnormality   Review of Systems  Constitutional: Negative for fever.  HENT: Negative for congestion.   Respiratory: Negative for cough.   Cardiovascular: Negative for chest pain.  Gastrointestinal: Negative for abdominal pain.  Musculoskeletal: Positive for arthralgias and back pain.  Skin: Negative for color change.  Neurological: Positive for numbness.  Hematological: Negative for adenopathy.    HISTORY: Past Medical, Surgical, Social, and Family History Reviewed & Updated per EMR.   Pertinent Historical Findings include:  Past Medical History:  Diagnosis Date  . Bilateral ovarian cysts   . Dyspareunia in female 08/21/2015  . Hair loss 08/21/2015  . Irregular periods 08/21/2015  . Irritable 08/21/2015  . Menorrhagia   . Mental disorder   . Urinary urgency 08/21/2015  . Vaginal discharge 08/21/2015     Past Surgical History:  Procedure Laterality Date  . CHOLECYSTECTOMY    . vertical sleeve   01/14/2015    No Known Allergies  Family History  Problem Relation Age of Onset  . Varicose Veins Mother   . Hypertension Mother   . Depression Mother   . Anxiety disorder Mother   . Diabetes Paternal Aunt   . Arthritis Maternal Grandmother   . Alzheimer's disease Maternal Grandmother   . Stroke Maternal Grandmother   . Cancer Maternal Grandfather        lung  . Schizophrenia Maternal Grandfather   . Schizophrenia Father   . Multiple sclerosis Maternal Aunt   . Colon cancer Maternal Aunt      Social History   Socioeconomic History  . Marital status: Divorced    Spouse name: Not on file  . Number of children: Not on file  . Years of education: Not on file  . Highest education level: Not on file  Occupational History  . Not on file  Social Needs  . Financial resource strain: Not on file  . Food insecurity    Worry: Not on file    Inability: Not on file  . Transportation needs    Medical: Not on file    Non-medical: Not on file  Tobacco Use  . Smoking status: Former Smoker    Types: Cigarettes  . Smokeless tobacco: Never Used  Substance and Sexual Activity  . Alcohol use: No  . Drug use: No  . Sexual activity: Yes  Birth control/protection: Surgical    Comment: husband had vasectomy  Lifestyle  . Physical activity    Days per week: Not on file    Minutes per session: Not on file  . Stress: Not on file  Relationships  . Social Musicianconnections    Talks on phone: Not on file    Gets together: Not on file    Attends religious service: Not on file    Active member of club or organization: Not on file    Attends meetings of clubs or organizations: Not on file    Relationship status: Not on file  . Intimate partner violence    Fear of current or ex partner: Not on file    Emotionally abused: Not on file    Physically abused: Not on file    Forced sexual activity:  Not on file  Other Topics Concern  . Not on file  Social History Narrative  . Not on file     PHYSICAL EXAM:  VS: BP 122/78   Ht 5\' 8"  (1.727 m)   Wt 209 lb (94.8 kg)   BMI 31.78 kg/m  Physical Exam Gen: NAD, alert, cooperative with exam, well-appearing ENT: normal lips, normal nasal mucosa,  Eye: normal EOM, normal conjunctiva and lids CV:  no edema, +2 pedal pulses   Resp: no accessory muscle use, non-labored,  Skin: no rashes, no areas of induration  Neuro: normal tone, normal sensation to touch, cranial nerves II through XII intact Psych:  normal insight, alert and oriented MSK:  Normal flexion extension of the back. Normal shoulder range of motion. Normal grip strength. Normal gait. Normal strength resistance with knee flexion and extension. +2 deep tendon reflexes at the patella. Neurovascular intact     ASSESSMENT & PLAN:   Chiari malformation type I (HCC) Has been previously evaluated by neurology a few years ago for similar symptoms.  At that time they did not warrant any further neurological work-up.  Lab work by primary has been unrevealing up to this point.  Possibly related to stress and anxiety versus fibromyalgia -MRI brain. -May need to consider referral to neurology -Could consider Cymbalta

## 2018-09-16 NOTE — Patient Instructions (Signed)
Nice to meet you Please try Aspercreme with lidocaine or salon pas.  Please try the pennsaid  Please send me a message in MyChart with any questions or updates.   You will get a call to schedule the MRI  We will set up an appointment to discuss the MRI.   --Dr. Raeford Razor

## 2018-09-19 ENCOUNTER — Other Ambulatory Visit: Payer: Self-pay | Admitting: Family Medicine

## 2018-09-19 MED ORDER — CYCLOBENZAPRINE HCL 10 MG PO TABS: 10.0000 mg | ORAL_TABLET | Freq: Three times a day (TID) | ORAL | 1 refills | Status: DC | PRN

## 2018-09-19 NOTE — Assessment & Plan Note (Signed)
Has been previously evaluated by neurology a few years ago for similar symptoms.  At that time they did not warrant any further neurological work-up.  Lab work by primary has been unrevealing up to this point.  Possibly related to stress and anxiety versus fibromyalgia -MRI brain. -May need to consider referral to neurology -Could consider Cymbalta

## 2018-09-20 ENCOUNTER — Other Ambulatory Visit: Payer: Self-pay | Admitting: Family Medicine

## 2018-09-20 MED ORDER — CYCLOBENZAPRINE HCL 10 MG PO TABS: 10.0000 mg | ORAL_TABLET | Freq: Three times a day (TID) | ORAL | 0 refills | Status: DC | PRN

## 2018-10-14 ENCOUNTER — Ambulatory Visit
Admission: RE | Admit: 2018-10-14 | Discharge: 2018-10-14 | Disposition: A | Discharge status: Home / Self Care | Payer: 59 | Source: Ambulatory Visit | Attending: Family Medicine | Admitting: Family Medicine

## 2018-10-14 ENCOUNTER — Other Ambulatory Visit: Payer: Self-pay

## 2018-10-14 DIAGNOSIS — G935 Compression of brain: Secondary | ICD-10-CM

## 2018-10-14 MED ORDER — GADOBENATE DIMEGLUMINE 529 MG/ML IV SOLN
19.0000 mL | Freq: Once | INTRAVENOUS | Status: AC | PRN
  Administered 2018-10-14: 19 mL via INTRAVENOUS

## 2018-10-25 ENCOUNTER — Other Ambulatory Visit: Payer: Self-pay

## 2018-10-25 ENCOUNTER — Encounter: Payer: Self-pay | Admitting: Family Medicine

## 2018-10-25 ENCOUNTER — Ambulatory Visit (INDEPENDENT_AMBULATORY_CARE_PROVIDER_SITE_OTHER): Payer: 59 | Admitting: Family Medicine

## 2018-10-25 DIAGNOSIS — G935 Compression of brain: Secondary | ICD-10-CM | POA: Diagnosis not present

## 2018-10-25 MED ORDER — METHOCARBAMOL 500 MG PO TABS: 500.0000 mg | ORAL_TABLET | Freq: Two times a day (BID) | ORAL | 0 refills | Status: DC | PRN

## 2018-10-25 NOTE — Progress Notes (Signed)
Medication Samples have been provided to the patient.  Drug name: Duexis      Strength: 800mg /26.6mg      Qty: 1 Box  LOT: [id number]  Exp.Date: 12/2019  Dosing instructions: Take 1 tablet three (3) times a day.  The patient has been instructed regarding the correct time, dose, and frequency of taking this medication, including desired effects and most common side effects.   Sherrie George, MA 3:11 PM 10/25/2018

## 2018-10-25 NOTE — Patient Instructions (Signed)
Good to see you Please try the new muscle relaxer. Please try the duexis and let us know if you tolerate it. We can send more in.    Please send me a message in MyChart with any questions or updates.  Please see [place] back as needed.   --Dr. Raeford Razor

## 2018-10-25 NOTE — Progress Notes (Signed)
Kayla Wilkins - 28 y.o. female MRN [id number]  Date of birth: 12-Aug-1990  SUBJECTIVE:  Including CC & ROS.  Chief Complaint  Patient presents with  . Follow-up    follow up MRI    Kayla Wilkins is a 28 y.o. female that is following up after her MRI of her brain.  She reports her symptoms have been ongoing.  She experiences altered sensation in her extremities intermittently.  She also experiences pain in her back and extremities as well.  She is concerned that her Chiari malformation may be the responsible for some of these changes.   Review of Systems  Constitutional: Negative for fever.  HENT: Negative for congestion.   Cardiovascular: Negative for chest pain.  Gastrointestinal: Negative for abdominal distention.  Musculoskeletal: Positive for back pain and myalgias.  Skin: Negative for color change.  Neurological: Positive for numbness. Negative for tremors.  Hematological: Negative for adenopathy.    HISTORY: Past Medical, Surgical, Social, and Family History Reviewed & Updated per EMR.   Pertinent Historical Findings include:  Past Medical History:  Diagnosis Date  . Bilateral ovarian cysts   . Dyspareunia in female 08/21/2015  . Hair loss 08/21/2015  . Irregular periods 08/21/2015  . Irritable 08/21/2015  . Menorrhagia   . Mental disorder   . Urinary urgency 08/21/2015  . Vaginal discharge 08/21/2015    Past Surgical History:  Procedure Laterality Date  . CHOLECYSTECTOMY    . vertical sleeve   01/14/2015    No Known Allergies  Family History  Problem Relation Age of Onset  . Varicose Veins Mother   . Hypertension Mother   . Depression Mother   . Anxiety disorder Mother   . Diabetes Paternal Aunt   . Arthritis Maternal Grandmother   . Alzheimer's disease Maternal Grandmother   . Stroke Maternal Grandmother   . Cancer Maternal Grandfather        lung  . Schizophrenia Maternal Grandfather   . Schizophrenia Father   . Multiple sclerosis Maternal Aunt   . Colon  cancer Maternal Aunt      Social History   Socioeconomic History  . Marital status: Divorced    Spouse name: Not on file  . Number of children: Not on file  . Years of education: Not on file  . Highest education level: Not on file  Occupational History  . Not on file  Social Needs  . Financial resource strain: Not on file  . Food insecurity    Worry: Not on file    Inability: Not on file  . Transportation needs    Medical: Not on file    Non-medical: Not on file  Tobacco Use  . Smoking status: Former Smoker    Types: Cigarettes  . Smokeless tobacco: Never Used  Substance and Sexual Activity  . Alcohol use: No  . Drug use: No  . Sexual activity: Yes    Birth control/protection: Surgical    Comment: husband had vasectomy  Lifestyle  . Physical activity    Days per week: Not on file    Minutes per session: Not on file  . Stress: Not on file  Relationships  . Social Herbalist on phone: Not on file    Gets together: Not on file    Attends religious service: Not on file    Active member of club or organization: Not on file    Attends meetings of clubs or organizations: Not on file    Relationship status:  Not on file  . Intimate partner violence    Fear of current or ex partner: Not on file    Emotionally abused: Not on file    Physically abused: Not on file    Forced sexual activity: Not on file  Other Topics Concern  . Not on file  Social History Narrative  . Not on file     PHYSICAL EXAM:  VS: Ht 5\' 8"  (1.727 m)   Wt 212 lb (96.2 kg)   BMI 32.23 kg/m  Physical Exam Gen: NAD, alert, cooperative with exam, well-appearing ENT: normal lips, normal nasal mucosa,  Eye: normal EOM, normal conjunctiva and lids CV:  no edema, +2 pedal pulses   Resp: no accessory muscle use, non-labored,   Skin: no rashes, no areas of induration  Neuro: normal tone, normal sensation to touch Psych:  normal insight, alert and oriented MSK: Normal strength, normal  gait, neurovascular intact     ASSESSMENT & PLAN:   Chiari malformation type I (HCC) The MRI with the patient.  She is concerned that the malformation has extended further than previous images. -Counseled we can put a referral into neurology. -Counseled on supportive care.

## 2018-10-26 ENCOUNTER — Telehealth: Payer: Self-pay | Admitting: Family Medicine

## 2018-10-26 NOTE — Telephone Encounter (Signed)
Left VM for patient. If she calls back please have her speak with a nurse/CMA and inform that I spoke with Dr. Ethelene Hal. He has referred people to neurologist Dr. Harrie Foreman in the past. We can make this referral or if she has a preference to go somewhere else.   If any questions then please take the best time and phone number to call and I will try to call her back.   Rosemarie Ax, MD [facility] 10/26/2018, 11:39 AM

## 2018-10-26 NOTE — Assessment & Plan Note (Signed)
The MRI with the patient.  She is concerned that the malformation has extended further than previous images. -Counseled we can put a referral into neurology. -Counseled on supportive care.

## 2018-10-27 ENCOUNTER — Telehealth: Payer: Self-pay | Admitting: Family Medicine

## 2018-10-27 DIAGNOSIS — G935 Compression of brain: Secondary | ICD-10-CM

## 2018-10-27 NOTE — Telephone Encounter (Signed)
Spoke with patient regarding neurology referral and she would like a referral to Dr. Harrie Foreman

## 2018-10-27 NOTE — Telephone Encounter (Signed)
Placed referral.   Rosemarie Ax, MD [facility] 10/27/2018, 11:55 AM

## 2018-11-02 ENCOUNTER — Other Ambulatory Visit: Payer: Self-pay

## 2018-11-03 ENCOUNTER — Ambulatory Visit: Payer: 59 | Admitting: Family Medicine

## 2018-11-03 ENCOUNTER — Encounter: Payer: Self-pay | Admitting: Family Medicine

## 2018-11-03 ENCOUNTER — Telehealth: Payer: Self-pay

## 2018-11-03 VITALS — BP 100/70 | HR 78 | Temp 98.0°F | Ht 68.0 in | Wt 211.0 lb

## 2018-11-03 DIAGNOSIS — N912 Amenorrhea, unspecified: Secondary | ICD-10-CM

## 2018-11-03 DIAGNOSIS — R6882 Decreased libido: Secondary | ICD-10-CM | POA: Diagnosis not present

## 2018-11-03 LAB — BASIC METABOLIC PANEL
BUN: 14 mg/dL (ref 6–23)
CO2: 30 mEq/L (ref 19–32)
Calcium: 9.1 mg/dL (ref 8.4–10.5)
Chloride: 103 mEq/L (ref 96–112)
Creatinine, Ser: 0.72 mg/dL (ref 0.40–1.20)
GFR: 94.89 mL/min (ref >60.00)
Glucose, Bld: 69 mg/dL — ABNORMAL LOW (ref 70–99)
Potassium: 3.8 mEq/L (ref 3.5–5.1)
Sodium: 138 mEq/L (ref 135–145)

## 2018-11-03 LAB — CBC
HCT: 42 % (ref 36.0–46.0)
Hemoglobin: 14.3 g/dL (ref 12.0–15.0)
MCHC: 34.1 g/dL (ref 30.0–36.0)
MCV: 98.8 fl (ref 78.0–100.0)
Platelets: 399 10*3/uL (ref 150.0–400.0)
RBC: 4.25 Mil/uL (ref 3.87–5.11)
RDW: 12.8 % (ref 11.5–15.5)
WBC: 9.5 10*3/uL (ref 4.0–10.5)

## 2018-11-03 LAB — FOLLICLE STIMULATING HORMONE: FSH: 2.4 m[IU]/mL

## 2018-11-03 LAB — TSH: TSH: 0.41 u[IU]/mL (ref 0.35–4.50)

## 2018-11-03 NOTE — Telephone Encounter (Signed)
Copied from [place] [id number]. Topic: General - Other >> Nov 03, 2018  4:20 PM Keene Breath wrote: Reason for CRM: Patient called to ask the doctor to send the prescription that they spoke about at her appt. To the pharmacy.  She called the pharmacy and they still do not have it.  CB# [phone]

## 2018-11-03 NOTE — Progress Notes (Addendum)
Established Patient Office Visit  Subjective:  Patient ID: Kayla Wilkins, female    DOB: 01/14/1991  Age: 28 y.o. MRN: [id number]  CC:  Chief Complaint  Patient presents with  . Amenorrhea    negative pregnancy tests, blood test.     HPI Kayla Wilkins presents for evaluation of amenorrhea.  G0 P0.  LMP/7/31 through 8/3.  Has been on no contraception.  No history of amenorrhea.  Menses are typically regular.  Significant increase in weight.  Increased fatigue.  Hair loss.  Decreased libido.  Has been under a lot of stress.  Moderately exercising.  Past Medical History:  Diagnosis Date  . Bilateral ovarian cysts   . Dyspareunia in female 08/21/2015  . Hair loss 08/21/2015  . Irregular periods 08/21/2015  . Irritable 08/21/2015  . Menorrhagia   . Mental disorder   . Urinary urgency 08/21/2015  . Vaginal discharge 08/21/2015    Past Surgical History:  Procedure Laterality Date  . CHOLECYSTECTOMY    . vertical sleeve   01/14/2015    Family History  Problem Relation Age of Onset  . Varicose Veins Mother   . Hypertension Mother   . Depression Mother   . Anxiety disorder Mother   . Diabetes Paternal Aunt   . Arthritis Maternal Grandmother   . Alzheimer's disease Maternal Grandmother   . Stroke Maternal Grandmother   . Cancer Maternal Grandfather        lung  . Schizophrenia Maternal Grandfather   . Schizophrenia Father   . Multiple sclerosis Maternal Aunt   . Colon cancer Maternal Aunt     Social History   Socioeconomic History  . Marital status: Divorced    Spouse name: Not on file  . Number of children: Not on file  . Years of education: Not on file  . Highest education level: Not on file  Occupational History  . Not on file  Social Needs  . Financial resource strain: Not on file  . Food insecurity    Worry: Not on file    Inability: Not on file  . Transportation needs    Medical: Not on file    Non-medical: Not on file  Tobacco Use  . Smoking status: Former  Smoker    Types: Cigarettes  . Smokeless tobacco: Never Used  Substance and Sexual Activity  . Alcohol use: No  . Drug use: No  . Sexual activity: Yes    Birth control/protection: Surgical    Comment: husband had vasectomy  Lifestyle  . Physical activity    Days per week: Not on file    Minutes per session: Not on file  . Stress: Not on file  Relationships  . Social Herbalist on phone: Not on file    Gets together: Not on file    Attends religious service: Not on file    Active member of club or organization: Not on file    Attends meetings of clubs or organizations: Not on file    Relationship status: Not on file  . Intimate partner violence    Fear of current or ex partner: Not on file    Emotionally abused: Not on file    Physically abused: Not on file    Forced sexual activity: Not on file  Other Topics Concern  . Not on file  Social History Narrative  . Not on file    Outpatient Medications Prior to Visit  Medication Sig Dispense Refill  . amphetamine-dextroamphetamine (ADDERALL)  15 MG tablet Take 15 mg by mouth 3 (three) times daily.    . cyclobenzaprine (FLEXERIL) 10 MG tablet Take 1 tablet (10 mg total) by mouth 3 (three) times daily as needed. 30 tablet 0  . diazepam (VALIUM) 5 MG tablet Take 1 tablet by mouth 3 (three) times daily.    . Diclofenac Sodium (PENNSAID) 2 % SOLN Place 1 application onto the skin 2 (two) times daily. 112 g 3  . methocarbamol (ROBAXIN) 500 MG tablet Take 1 tablet (500 mg total) by mouth 2 (two) times daily as needed for muscle spasms. 30 tablet 0   No facility-administered medications prior to visit.     No Known Allergies  ROS Review of Systems  Constitutional: Positive for fatigue and unexpected weight change. Negative for diaphoresis and fever.  Respiratory: Negative.   Cardiovascular: Negative.   Gastrointestinal: Negative.   Endocrine: Negative for polyphagia and polyuria.  Genitourinary: Negative.  Negative for  pelvic pain, vaginal bleeding and vaginal discharge.  Musculoskeletal: Negative.   Skin: Negative.   Neurological: Positive for numbness. Negative for weakness.  Hematological: Negative.  Does not bruise/bleed easily.      Objective:    Physical Exam  Constitutional: She is oriented to person, place, and time. She appears well-developed and well-nourished. No distress.  HENT:  Head: Normocephalic and atraumatic.  Right Ear: External ear normal.  Left Ear: External ear normal.  Eyes: Conjunctivae are normal. Right eye exhibits no discharge. Left eye exhibits no discharge. No scleral icterus.  Neck: No JVD present. No tracheal deviation present.  Cardiovascular: Normal rate, regular rhythm and normal heart sounds.  Pulmonary/Chest: Effort normal and breath sounds normal. No stridor.  Musculoskeletal:        General: No edema.  Neurological: She is alert and oriented to person, place, and time.  Skin: Skin is warm and dry. She is not diaphoretic.  Psychiatric: She has a normal mood and affect. Her behavior is normal.    BP 100/70   Pulse 78   Temp 98 F (36.7 C) (Temporal)   Ht 5\' 8"  (1.727 m)   Wt 211 lb (95.7 kg)   SpO2 98%   BMI 32.08 kg/m  Wt Readings from Last 3 Encounters:  11/03/18 211 lb (95.7 kg)  10/25/18 212 lb (96.2 kg)  09/16/18 209 lb (94.8 kg)   BP Readings from Last 3 Encounters:  11/03/18 100/70  09/16/18 122/78  09/12/18 110/70   Guideline developer:  UpToDate (see UpToDate for funding source) Date Released: June 2014  Health Maintenance Due  Topic Date Due  . HIV Screening  07/03/2003  . PAP SMEAR-Modifier  05/08/2017    There are no preventive care reminders to display for this patient.  Lab Results  Component Value Date   TSH 0.41 11/03/2018   Lab Results  Component Value Date   WBC 9.5 11/03/2018   HGB 14.3 11/03/2018   HCT 42.0 11/03/2018   MCV 98.8 11/03/2018   PLT 399.0 11/03/2018   Lab Results  Component Value Date   NA 138  11/03/2018   K 3.8 11/03/2018   CO2 30 11/03/2018   GLUCOSE 69 (L) 11/03/2018   BUN 14 11/03/2018   CREATININE 0.72 11/03/2018   BILITOT 0.4 09/12/2018   ALKPHOS 42 09/12/2018   AST 19 09/12/2018   ALT 16 09/12/2018   PROT 6.5 09/12/2018   ALBUMIN 4.3 09/12/2018   CALCIUM 9.1 11/03/2018   GFR 94.89 11/03/2018   Lab Results  Component Value  Date   CHOL 97 02/09/2013   Lab Results  Component Value Date   HDL 34 (L) 02/09/2013   Lab Results  Component Value Date   LDLCALC 51 02/09/2013   Lab Results  Component Value Date   TRIG 62 02/09/2013   Lab Results  Component Value Date   CHOLHDL 2.9 02/09/2013   Lab Results  Component Value Date   HGBA1C 5.5 02/09/2013      Assessment & Plan:   Problem List Items Addressed This Visit      Other   Libido, decreased   Amenorrhea - Primary   Relevant Medications   medroxyPROGESTERone (PROVERA) 10 MG tablet   Other Relevant Orders   CBC (Completed)   TSH (Completed)   Pregnancy, urine (Completed)   hCG, serum, qualitative (Completed)   Prolactin (Completed)   FSH (Completed)   Ambulatory referral to Gynecology   Basic metabolic panel (Completed)      Meds ordered this encounter  Medications  . medroxyPROGESTERone (PROVERA) 10 MG tablet    Sig: Take one pill daily until menses starts and then continue for one more day.    Dispense:  7 tablet    Refill:  0    Follow-up: Return if symptoms worsen or fail to improve.   Progesterone challenge negative work-up.

## 2018-11-03 NOTE — Telephone Encounter (Signed)
Awaiting blood work results

## 2018-11-04 LAB — PROLACTIN: Prolactin: 7.8 ng/mL

## 2018-11-04 LAB — HCG, SERUM, QUALITATIVE: Preg, Serum: NEGATIVE

## 2018-11-04 LAB — PREGNANCY, URINE: Preg Test, Ur: NEGATIVE

## 2018-11-04 MED ORDER — MEDROXYPROGESTERONE ACETATE 10 MG PO TABS: ORAL_TABLET | ORAL | 0 refills | Status: DC

## 2018-11-04 NOTE — Telephone Encounter (Signed)
Sent medroxyprogesterone rx to pharmacy.

## 2018-11-04 NOTE — Addendum Note (Signed)
Addended by: Jon Billings on: 11/04/2018 09:09 AM   Modules accepted: Orders

## 2018-11-04 NOTE — Telephone Encounter (Signed)
Results are back

## 2018-11-04 NOTE — Telephone Encounter (Signed)
See result note.  

## 2018-11-07 ENCOUNTER — Other Ambulatory Visit: Payer: Self-pay

## 2018-11-08 ENCOUNTER — Encounter: Payer: Self-pay | Admitting: Obstetrics & Gynecology

## 2018-11-08 ENCOUNTER — Ambulatory Visit: Payer: 59 | Admitting: Obstetrics & Gynecology

## 2018-11-08 VITALS — BP 130/76 | Ht 67.5 in | Wt 206.0 lb

## 2018-11-08 DIAGNOSIS — Z6831 Body mass index (BMI) 31.0-31.9, adult: Secondary | ICD-10-CM

## 2018-11-08 DIAGNOSIS — N914 Secondary oligomenorrhea: Secondary | ICD-10-CM | POA: Diagnosis not present

## 2018-11-08 DIAGNOSIS — E6609 Other obesity due to excess calories: Secondary | ICD-10-CM

## 2018-11-08 NOTE — Progress Notes (Signed)
Kayla Wilkins 03/11/1990 [id number]   History:    28 y.o. G0 Single  RP:  New patient presenting for oligomenorrhea  HPI: Oligomenorrhea.  No menses x > 2 months, LMP 08/19/2018.  Occasional pelvic cramps since then, but no vaginal bleeding at all.  Abstinent x 1st week of 08/2018.  HPT and UPT/sPT negative recently.  Weight gain of 20 Lbs in the last few months.  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  Past medical history,surgical history, family history and social history were all reviewed and documented in the EPIC chart.  Gynecologic History Patient's last menstrual period was 08/19/2018. Contraception: abstinence and condoms Last Pap: 04/2014. Results were: Negative  Obstetric History OB History  Gravida Para Term Preterm AB Living  0 0 0 0 0 0  SAB TAB Ectopic Multiple Live Births  0 0 0 0 0     ROS: A ROS was performed and pertinent positives and negatives are included in the history.  GENERAL: No fevers or chills. HEENT: No change in vision, no earache, sore throat or sinus congestion. NECK: No pain or stiffness. CARDIOVASCULAR: No chest pain or pressure. No palpitations. PULMONARY: No shortness of breath, cough or wheeze. GASTROINTESTINAL: No abdominal pain, nausea, vomiting or diarrhea, melena or bright red blood per rectum. GENITOURINARY: No urinary frequency, urgency, hesitancy or dysuria. MUSCULOSKELETAL: No joint or muscle pain, no back pain, no recent trauma. DERMATOLOGIC: No rash, no itching, no lesions. ENDOCRINE: No polyuria, polydipsia, no heat or cold intolerance. No recent change in weight. HEMATOLOGICAL: No anemia or easy bruising or bleeding. NEUROLOGIC: No headache, seizures, numbness, tingling or weakness. PSYCHIATRIC: No depression, no loss of interest in normal activity or change in sleep pattern.     Exam:   BP 130/76   Ht 5' 7.5" (1.715 m)   Wt 206 lb (93.4 kg)   LMP 08/19/2018   BMI 31.79 kg/m   Body mass index is 31.79 kg/m.   General appearance : Well developed well nourished female. No acute distress HEENT: Eyes: no retinal hemorrhage or exudates,  Neck supple, trachea midline, no carotid bruits, no thyroidmegaly  Pelvic: Vulva: Normal             Vagina: No gross lesions or discharge  Cervix: No gross lesions or discharge  Uterus  AV, normal size, shape and consistency, non-tender and mobile  Adnexa  Without masses or tenderness  Anus: Normal  Labs 11/03/2018:  TSH normal, Prolactin normal.  FSH 2.4.  UPT, SPT Negative.  CBC normal.  CMP wnl.   Assessment/Plan:  28 y.o. female for annual exam   1. Secondary oligomenorrhea Probable oligo ovulation of unknown cause.  Labs recently normal, will complete with a full thyroid panel.  Patient will take the Provera already prescribed for a withdrawal bleeding.  Recommend starting today with Provera 5 mg daily x 10 days.  F/U pelvic US.  Will decide on management at next visit when all results are available.  Possible referral to an endocrinologist as needed. - Thyroid Panel With TSH - Thyroid stimulating immunoglobulin - US Transvaginal Non-OB; Future  2. 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  Recent weight gain of 20 pounds.  Recommend a lower calorie/carb diet such as [employer] and aerobic physical activities 5 times a week with weightlifting every 2 days.  Other orders - diazepam (  DIASTAT) 2.5 MG GEL; Place 2.5 mg rectally once.  Counseling on above issues and coordination of care more than 50% for 30 minutes.  [name] MD, 2:28 PM 11/08/2018

## 2018-11-09 ENCOUNTER — Encounter: Payer: Self-pay | Admitting: Obstetrics & Gynecology

## 2018-11-09 NOTE — Patient Instructions (Signed)
1. Secondary oligomenorrhea Probable oligo ovulation of unknown cause.  Labs recently normal, will complete with a full thyroid panel.  Patient will take the Provera already prescribed for a withdrawal bleeding.  Recommend starting today with Provera 5 mg daily x 10 days.  F/U pelvic US.  Will decide on management at next visit when all results are available.  Possible referral to an endocrinologist as needed. - Thyroid Panel With TSH - Thyroid stimulating immunoglobulin - US Transvaginal Non-OB; Future  2. 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  Recent weight gain of 20 pounds.  Recommend a lower calorie/carb diet such as [employer] and aerobic physical activities 5 times a week with weightlifting every 2 days.  Other orders - diazepam (DIASTAT) 2.5 MG GEL; Place 2.5 mg rectally once.  Dailah, it was a pleasure meeting you today!  I will inform you of your results as soon as they are available.

## 2018-11-13 ENCOUNTER — Encounter: Payer: Self-pay | Admitting: Family Medicine

## 2018-11-13 LAB — THYROID PANEL WITH TSH
Free Thyroxine Index: 2.3 (ref 1.4–3.8)
T3 Uptake: 38 % — ABNORMAL HIGH (ref 22–35)
T4, Total: 6.1 ug/dL (ref 5.1–11.9)
TSH: 0.51 mIU/L

## 2018-11-13 LAB — THYROID STIMULATING IMMUNOGLOBULIN: TSI: <89 % baseline (ref <140)

## 2018-11-14 ENCOUNTER — Other Ambulatory Visit: Payer: Self-pay

## 2018-11-14 DIAGNOSIS — N912 Amenorrhea, unspecified: Secondary | ICD-10-CM

## 2018-11-14 MED ORDER — MEDROXYPROGESTERONE ACETATE 10 MG PO TABS: ORAL_TABLET | ORAL | 0 refills | Status: DC

## 2018-11-14 NOTE — Telephone Encounter (Signed)
Akilah,   Dr. Ethelene Hal had already sent in a 7 day supply.  She only needs an additional 3 days to complete 10 day course.  Please contact pharmacy and update.  Thanks!

## 2018-11-14 NOTE — Telephone Encounter (Signed)
It appears that Dr. Dellis Filbert wanted her to continue provera x10 days.  Please send in an additional 3 days or medication.  She should keep regular scheduled follow up with Dr. Dellis Filbert as well to discuss next steps if not responding to provera.

## 2018-11-20 DIAGNOSIS — R198 Other specified symptoms and signs involving the digestive system and abdomen: Secondary | ICD-10-CM | POA: Insufficient documentation

## 2018-11-20 DIAGNOSIS — G43909 Migraine, unspecified, not intractable, without status migrainosus: Secondary | ICD-10-CM | POA: Insufficient documentation

## 2018-11-20 DIAGNOSIS — R102 Pelvic and perineal pain: Secondary | ICD-10-CM | POA: Insufficient documentation

## 2018-12-05 ENCOUNTER — Other Ambulatory Visit: Payer: Self-pay

## 2018-12-06 ENCOUNTER — Other Ambulatory Visit: Payer: 59

## 2018-12-06 ENCOUNTER — Ambulatory Visit: Payer: 59 | Admitting: Obstetrics & Gynecology

## 2018-12-28 ENCOUNTER — Other Ambulatory Visit: Payer: Self-pay

## 2018-12-29 ENCOUNTER — Ambulatory Visit: Payer: 59 | Admitting: Obstetrics & Gynecology

## 2018-12-29 ENCOUNTER — Ambulatory Visit (INDEPENDENT_AMBULATORY_CARE_PROVIDER_SITE_OTHER): Payer: 59

## 2018-12-29 ENCOUNTER — Encounter: Payer: Self-pay | Admitting: Obstetrics & Gynecology

## 2018-12-29 DIAGNOSIS — N914 Secondary oligomenorrhea: Secondary | ICD-10-CM | POA: Diagnosis not present

## 2018-12-29 DIAGNOSIS — Z6831 Body mass index (BMI) 31.0-31.9, adult: Secondary | ICD-10-CM

## 2018-12-29 DIAGNOSIS — N83202 Unspecified ovarian cyst, left side: Secondary | ICD-10-CM

## 2018-12-29 DIAGNOSIS — N854 Malposition of uterus: Secondary | ICD-10-CM | POA: Diagnosis not present

## 2018-12-29 DIAGNOSIS — E6609 Other obesity due to excess calories: Secondary | ICD-10-CM

## 2018-12-29 MED ORDER — MEDROXYPROGESTERONE ACETATE 5 MG PO TABS: 5.0000 mg | ORAL_TABLET | Freq: Every day | ORAL | 4 refills | Status: DC

## 2018-12-29 NOTE — Progress Notes (Signed)
    Kayla Wilkins Mar 06, 1990 [id number]        28 y.o.  G0P0000 Single  RP: Secondary Oligomenorrhea for Pelvic US  HPI: Had a positive withdrawal bleeding post Provera.  On 11/08/18 we noted:  Oligomenorrhea.  No menses x > 4 months, LMP 08/19/2018.  Occasional pelvic cramps since then, but no vaginal bleeding at all.  Abstinent x 1st week of 08/2018.  HPT and UPT/sPT negative recently.  Weight gain of 20 Lbs in the last few months.  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   OB History  Gravida Para Term Preterm AB Living  0 0 0 0 0 0  SAB TAB Ectopic Multiple Live Births  0 0 0 0 0    Past medical history,surgical history, problem list, medications, allergies, family history and social history were all reviewed and documented in the EPIC chart.   Directed ROS with pertinent positives and negatives documented in the history of present illness/assessment and plan.  Exam:  There were no vitals filed for this visit. General appearance:  Normal  Pelvic US today: T/V images.  Anteverted uterus normal in size and shape measured at 7.54 x 5.06 x 3.72 cm.  1 very small intramural fibroid was seen and measured at 0.9 cm.  The endometrial lining appears secretary and is measured at 5.51 mm.  Right ovary normal with a small follicle measured at 1.3 x 0.5 cm.  Left ovary is slightly enlarged with an avascular thin-walled simple cyst measured at 3.5 cm.  No free fluid in the posterior cul-de-sac.  Labs 10/2018:  TSH normal, Prl normal, FSH normal.  CMP normal.  CBC normal.   Assessment/Plan:  28 y.o. G0P0000   1. Secondary oligomenorrhea Unexplained secondary Oligomenorrhea.  Pelvic US findings thoroughly reviewed with patient, reassured.  Had a positive withdrawal test post Provera.  Decision to continue with Cyclic Provera 5 mg daily x 10 days every 3 months.    2. Class 1 obesity due to excess calories without serious comorbidity with body mass index (BMI) of 31.0 to 31.9 in  adult Refer to Endocrinologist to investigate further patient's recent unexplained weight gain and Oligomenorrhea.  Other orders - medroxyPROGESTERone (PROVERA) 5 MG tablet; Take 1 tablet (5 mg total) by mouth daily for 10 days. 1 tab PO daily x 10 days every 3 months.  Counseling on the above issues and coordination of care more than 50% for 25 minutes.  Princess Bruins MD, 9:59 AM 12/29/2018

## 2018-12-30 ENCOUNTER — Telehealth: Payer: Self-pay | Admitting: *Deleted

## 2018-12-30 DIAGNOSIS — Z6831 Body mass index (BMI) 31.0-31.9, adult: Secondary | ICD-10-CM

## 2018-12-30 DIAGNOSIS — N914 Secondary oligomenorrhea: Secondary | ICD-10-CM

## 2018-12-30 DIAGNOSIS — E6609 Other obesity due to excess calories: Secondary | ICD-10-CM

## 2018-12-30 NOTE — Telephone Encounter (Signed)
-----   Message from Princess Bruins, MD sent at 12/29/2018 10:21 AM EST ----- Regarding: Refer to Endocrinology Unexplained weight gain.  H/O Sleeve procedure.  Oligomenorrhea, work-up negative.  Provera 5 mg x 10 days every 3 months for withdrawal bleeding.

## 2018-12-30 NOTE — Telephone Encounter (Signed)
Referral placed at [facility]. They will call to schedule.

## 2019-01-01 ENCOUNTER — Encounter: Payer: Self-pay | Admitting: Obstetrics & Gynecology

## 2019-01-01 NOTE — Patient Instructions (Signed)
1. Secondary oligomenorrhea Unexplained secondary Oligomenorrhea.  Pelvic US findings thoroughly reviewed with patient, reassured.  Had a positive withdrawal test post Provera.  Decision to continue with Cyclic Provera 5 mg daily x 10 days every 3 months.    2. Class 1 obesity due to excess calories without serious comorbidity with body mass index (BMI) of 31.0 to 31.9 in adult Refer to Endocrinologist to investigate further patient's recent unexplained weight gain and Oligomenorrhea.  Other orders - medroxyPROGESTERone (PROVERA) 5 MG tablet; Take 1 tablet (5 mg total) by mouth daily for 10 days. 1 tab PO daily x 10 days every 3 months.  Kayla Wilkins, it was a pleasure seeing you today!

## 2019-01-06 NOTE — Telephone Encounter (Signed)
[facility] left message x 2 to call and schedule. Encounter will be closed since patient has been notified

## 2019-02-13 ENCOUNTER — Encounter: Payer: Self-pay | Admitting: Internal Medicine

## 2019-02-14 ENCOUNTER — Other Ambulatory Visit: Payer: Self-pay

## 2019-02-14 ENCOUNTER — Ambulatory Visit (INDEPENDENT_AMBULATORY_CARE_PROVIDER_SITE_OTHER): Payer: 59 | Admitting: Obstetrics & Gynecology

## 2019-02-14 ENCOUNTER — Encounter: Payer: Self-pay | Admitting: Obstetrics & Gynecology

## 2019-02-14 VITALS — BP 128/80 | Ht 68.0 in | Wt 226.0 lb

## 2019-02-14 DIAGNOSIS — Z113 Encounter for screening for infections with a predominantly sexual mode of transmission: Secondary | ICD-10-CM | POA: Diagnosis not present

## 2019-02-14 DIAGNOSIS — E6609 Other obesity due to excess calories: Secondary | ICD-10-CM

## 2019-02-14 DIAGNOSIS — Z6834 Body mass index (BMI) 34.0-34.9, adult: Secondary | ICD-10-CM

## 2019-02-14 DIAGNOSIS — Z01419 Encounter for gynecological examination (general) (routine) without abnormal findings: Secondary | ICD-10-CM

## 2019-02-14 DIAGNOSIS — Z789 Other specified health status: Secondary | ICD-10-CM | POA: Diagnosis not present

## 2019-02-14 DIAGNOSIS — N914 Secondary oligomenorrhea: Secondary | ICD-10-CM

## 2019-02-14 NOTE — Progress Notes (Signed)
Kayla Wilkins 06-14-1990 [id number]   History:    29 y.o. G0  RP:  Established patient presenting for annual gyn exam   HPI: Oligo menorrhea recently started on cyclic Provera every 3 months.  No pelvic pain.  Pelvic ultrasound normal December 29, 2018.  Pending referral to endocrinology.  TSH, prolactin, FSH CMP and CBC were normal November 08, 2018.  Condoms as needed.  Would like STI screening today.  Breasts normal.  Urine and bowel movements normal.  Body mass index 34.36.  Difficulty losing weight.  Health labs with family physician.  Past medical history,surgical history, family history and social history were all reviewed and documented in the EPIC chart.  Gynecologic History No LMP recorded. (Menstrual status: Other).  Obstetric History OB History  Gravida Para Term Preterm AB Living  0 0 0 0 0 0  SAB TAB Ectopic Multiple Live Births  0 0 0 0 0     ROS: A ROS was performed and pertinent positives and negatives are included in the history.  GENERAL: No fevers or chills. HEENT: No change in vision, no earache, sore throat or sinus congestion. NECK: No pain or stiffness. CARDIOVASCULAR: No chest pain or pressure. No palpitations. PULMONARY: No shortness of breath, cough or wheeze. GASTROINTESTINAL: No abdominal pain, nausea, vomiting or diarrhea, melena or bright red blood per rectum. GENITOURINARY: No urinary frequency, urgency, hesitancy or dysuria. MUSCULOSKELETAL: No joint or muscle pain, no back pain, no recent trauma. DERMATOLOGIC: No rash, no itching, no lesions. ENDOCRINE: No polyuria, polydipsia, no heat or cold intolerance. No recent change in weight. HEMATOLOGICAL: No anemia or easy bruising or bleeding. NEUROLOGIC: No headache, seizures, numbness, tingling or weakness. PSYCHIATRIC: No depression, no loss of interest in normal activity or change in sleep pattern.     Exam:   BP 128/80 (BP Location: Right Arm, Patient Position: Sitting, Cuff Size: Normal)   Ht 5'  8" (1.727 m)   Wt 226 lb (102.5 kg)   BMI 34.36 kg/m   Body mass index is 34.36 kg/m.  General appearance : Well developed well nourished female. No acute distress HEENT: Eyes: no retinal hemorrhage or exudates,  Neck supple, trachea midline, no carotid bruits, no thyroidmegaly Lungs: Clear to auscultation, no rhonchi or wheezes, or rib retractions  Heart: Regular rate and rhythm, no murmurs or gallops Breast:Examined in sitting and supine position were symmetrical in appearance, no palpable masses or tenderness,  no skin retraction, no nipple inversion, no nipple discharge, no skin discoloration, no axillary or supraclavicular lymphadenopathy Abdomen: no palpable masses or tenderness, no rebound or guarding Extremities: no edema or skin discoloration or tenderness  Pelvic: Vulva: Normal             Vagina: No gross lesions or discharge  Cervix: No gross lesions or discharge.  Pap/HPV HR, Gono-Chlam done.  Uterus  AV, normal size, shape and consistency, non-tender and mobile  Adnexa  Without masses or tenderness  Anus: Normal  Pelvic US 12/29/2018: T/V images.  Anteverted uterus normal in size and shape measured at 7.54 x 5.06 x 3.72 cm.  1 very small intramural fibroid was seen and measured at 0.9 cm.  The endometrial lining appears secretary and is measured at 5.51 mm.  Right ovary normal with a small follicle measured at 1.3 x 0.5 cm.  Left ovary is slightly enlarged with an avascular thin-walled simple cyst measured at 3.5 cm.  No free fluid in the posterior cul-de-sac.  Labs 11/08/2018: TSH, Prl, FSH  all normal.  CMP and CBC normal.   Assessment/Plan:  29 y.o. female for annual exam   1. Encounter for routine gynecological examination with Papanicolaou smear of cervix Normal gynecologic exam.  Pap with high-risk HPV done today.  Breast exam normal.  Health labs with family physician.  2. Uses condoms for contraception  3. Screen for STD (sexually transmitted disease) Strict  condom use recommended. - Gono-Chlam on Pap - HIV antibody (with reflex) - RPR - Hepatitis C Antibody - Hepatitis B Surface AntiGEN  4. Secondary oligomenorrhea On Provera 5 mg daily x 10 every 3 months, started 12/2018, no need for represcription.  Will see Endocrinologist, had to reschedule.  5. Class 1 obesity due to excess calories without serious comorbidity with body mass index (BMI) of 34.0 to 34.9 in adult Recommend a low calorie/carb diet such as [employer].  Aerobic physical activities 5 times a week and light weightlifting every 2 days.  Patient will see an endocrinologist.  History of gastric sleeve surgery.  Other orders - b complex vitamins tablet; Take by mouth. - diclofenac Sodium (VOLTAREN) 1 % GEL; APPLY AS DIRECTED TOPICALLY THREE TIMES DAILY  [name] MD, 3:11 PM 02/14/2019

## 2019-02-15 LAB — HIV ANTIBODY (ROUTINE TESTING W REFLEX): HIV 1&2 Ab, 4th Generation: NONREACTIVE

## 2019-02-15 LAB — HEPATITIS C ANTIBODY
Hepatitis C Ab: NONREACTIVE
SIGNAL TO CUT-OFF: 0.01 (ref <1.00)

## 2019-02-15 LAB — HEPATITIS B SURFACE ANTIGEN: Hepatitis B Surface Ag: NONREACTIVE

## 2019-02-15 LAB — RPR: RPR Ser Ql: NONREACTIVE

## 2019-02-16 LAB — PAP IG, CT-NG NAA, HPV HIGH-RISK
C. trachomatis RNA, TMA: NOT DETECTED
HPV DNA High Risk: NOT DETECTED
N. gonorrhoeae RNA, TMA: NOT DETECTED

## 2019-02-17 ENCOUNTER — Encounter: Payer: Self-pay | Admitting: Obstetrics & Gynecology

## 2019-02-17 NOTE — Patient Instructions (Signed)
1. Encounter for routine gynecological examination with Papanicolaou smear of cervix Normal gynecologic exam.  Pap with high-risk HPV done today.  Breast exam normal.  Health labs with family physician.  2. Uses condoms for contraception  3. Screen for STD (sexually transmitted disease) Strict condom use recommended. - Gono-Chlam on Pap - HIV antibody (with reflex) - RPR - Hepatitis C Antibody - Hepatitis B Surface AntiGEN  4. Secondary oligomenorrhea On Provera 5 mg daily x 10 every 3 months, started 12/2018, no need for represcription.  Will see Endocrinologist, had to reschedule.  5. Class 1 obesity due to excess calories without serious comorbidity with body mass index (BMI) of 34.0 to 34.9 in adult Recommend a low calorie/carb diet such as [employer].  Aerobic physical activities 5 times a week and light weightlifting every 2 days.  Patient will see an endocrinologist.  History of gastric sleeve surgery.  Other orders - b complex vitamins tablet; Take by mouth. - diclofenac Sodium (VOLTAREN) 1 % GEL; APPLY AS DIRECTED TOPICALLY THREE TIMES DAILY  Kimesha, it was a pleasure seeing you today!  I will inform you of your results as soon as they are available.

## 2019-03-14 ENCOUNTER — Other Ambulatory Visit: Payer: Self-pay

## 2019-03-14 ENCOUNTER — Ambulatory Visit: Payer: 59 | Admitting: Internal Medicine

## 2019-03-14 ENCOUNTER — Encounter: Payer: Self-pay | Admitting: Internal Medicine

## 2019-03-14 VITALS — BP 120/70 | HR 80 | Ht 68.0 in | Wt 227.0 lb

## 2019-03-14 DIAGNOSIS — R5383 Other fatigue: Secondary | ICD-10-CM

## 2019-03-14 DIAGNOSIS — N915 Oligomenorrhea, unspecified: Secondary | ICD-10-CM

## 2019-03-14 LAB — LUTEINIZING HORMONE: LH: 4.46 m[IU]/mL

## 2019-03-14 LAB — T4, FREE: Free T4: 1.01 ng/dL (ref 0.60–1.60)

## 2019-03-14 LAB — FOLLICLE STIMULATING HORMONE: FSH: 3.8 m[IU]/mL

## 2019-03-14 LAB — VITAMIN B12: Vitamin B-12: 377 pg/mL (ref 211–911)

## 2019-03-14 LAB — VITAMIN D 25 HYDROXY (VIT D DEFICIENCY, FRACTURES): VITD: 30.45 ng/mL (ref 30.00–100.00)

## 2019-03-14 LAB — T3, FREE: T3, Free: 3.2 pg/mL (ref 2.3–4.2)

## 2019-03-14 LAB — TSH: TSH: 1.56 u[IU]/mL (ref 0.35–4.50)

## 2019-03-14 MED ORDER — DEXAMETHASONE 1 MG PO TABS: ORAL_TABLET | ORAL | 0 refills | Status: DC

## 2019-03-14 NOTE — Patient Instructions (Signed)
Please stop at the lab.  Please take Dexamethasone the night before coming for labs the next day at ~4-5 pm.  We will schedule an appt if the results are abnormal.

## 2019-03-14 NOTE — Progress Notes (Signed)
Patient ID: Kayla Wilkins, female   DOB: 07/22/1990, 29 y.o.   MRN: [id number]   This visit occurred during the SARS-CoV-2 public health emergency.  Safety protocols were in place, including screening questions prior to the visit, additional usage of staff PPE, and extensive cleaning of exam room while observing appropriate contact time as indicated for disinfecting solutions.   HPI: Kayla Wilkins is a 29 y.o. female, referred by Dr. Seymour Bars, in consultation for oligomenorrhea.  Patient describes that she started to have dizziness with exercising (running) in 04/2018. She had to stop running because of this. Afterwards, she started to gain weight and she estimates approximately 40 pounds since last summer.   She also escribes that she had regular menstrual cycles all her life, however, in 06-07/2018, her menstrual cycles stopped. She has been started on Provera for 10 days by OB/GYN. She had 2 cycles of this >> had w/al bleeds on this x2.  She also describes exhaustion, hair loss, rash on legs - psoriatiform, dizziness, lack of libido, vaginal dryness, urinary incontinence (urgency), migraines, hoarseness, palpitations, mm spasms.  She denies acne, but noticed more body hair, losing hair of on eyebrows, no wide, purple stretch marks.  Pt describes: - menarche at 6-12 y/o - regular menses until 06/2018-07/2018 - no h/o ovarian cysts per recent TV U/S - children: 0 - miscarriages: 0 - contraception: had Mirena IUDx 2 but they migrated around >> extracted ~2015; tried OCP >> weight gain; Depo provera >> significant weight gain  Previous investigation: Component     Latest Ref Rng & Units 08/21/2015 11/03/2018  Preg Test, Ur     NEGATIVE Negative NEGATIVE  Preg, Serum       NEGATIVE  FSH     mIU/ML  2.4   -No history of hyperprolactinemia: Lab Results  Component Value Date   PROLACTIN 7.8 11/03/2018   -No h/o hypo or hyperthyroidism; last thyroid tests: Lab Results  Component  Value Date   TSH 0.51 11/08/2018   -+ Mild dyslipidemia; last set of lipids:    Component Value Date/Time   CHOL 97 02/09/2013 0925   TRIG 62 02/09/2013 0925   HDL 34 (L) 02/09/2013 0925   CHOLHDL 2.9 02/09/2013 0925   VLDL 12 02/09/2013 0925   LDLCALC 51 02/09/2013 0925   -No prediabetes/diabetes.  Last HbA1c: Lab Results  Component Value Date   HGBA1C 5.5 02/09/2013   She has no infertility in aunt. Also, PID, endometriosis, Uterine cancer in aunts. Early menopause in mother.  She has a history of Chiari I malformation, obesity class III.  Of note, she takes a B complex - not in last month.  No steroids.  She takes vitamin D, Glucosamine-Chondroitin, prev. Fish oil, MVI.   She recently started to work nights at the end of 12/2018.  ROS: Constitutional: + see HPI, also; + subjective hypothermia, + excessive urination Eyes: + Blurry vision, no xerophthalmia ENT: no sore throat, no nodules palpated in neck, no dysphagia/odynophagia, + hoarseness Cardiovascular: + CP/no SOB/+ palpitations/+ leg swelling Respiratory: no cough/no SOB Gastrointestinal: no N/V/+ D/+ C Musculoskeletal: + muscle/+ joint aches Skin: no acne, no hair on face, no dark discoloration of skin, + dry skin, + rash R lower leg, + easy bruising and itching Neurological: Tremors tremors/numbness/tingling/+dizziness, + headaches Psychiatric: no depression/anxiety + Low libido  Past Medical History:  Diagnosis Date  . Bilateral ovarian cysts   . Dyspareunia in female 08/21/2015  . Hair loss 08/21/2015  . Irregular periods 08/21/2015  .  Irritable 08/21/2015  . Menorrhagia   . Mental disorder   . Urinary urgency 08/21/2015  . Vaginal discharge 08/21/2015   Past Surgical History:  Procedure Laterality Date  . CHOLECYSTECTOMY    . GALLBLADDER SURGERY  2009  . vertical sleeve   01/14/2015   Social History   Socioeconomic History  . Marital status: Divorced    Spouse name: Not on file  . Number of  children: 0  . Years of education: Not on file  . Highest education level: Not on file  Occupational History  .  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 Smoking status: Former Smoker, quit in 2006    Types: Cigarettes  . Smokeless tobacco: Never Used  Substance and Sexual Activity  . Alcohol use: Yes    Comment: WINE OR BEER WITH DINNER 4 times a week  . Drug use: No  . Sexual activity: Yes    Partners: Male    Birth control/protection: Condom    Comment: 1st intercourse- 19, partners- 31, divorced  Other Topics Concern  . Not on file  Social History Narrative  . Not on file   Social Determinants of Health   Financial Resource Strain:   . Difficulty of Paying Living Expenses: Not on file  Food Insecurity:   . Worried About Charity fundraiser in the Last Year: Not on file  . Ran Out of Food in the Last Year: Not on file  Transportation Needs:   . Lack of Transportation (Medical): Not on file  . Lack of Transportation (Non-Medical): Not on file  Physical Activity:   . Days of Exercise per Week: Not on file  . Minutes of Exercise per Session: Not on file  Stress:   . Feeling of Stress : Not on file  Social Connections:   . Frequency of Communication with Friends and Family: Not on file  . Frequency of Social Gatherings with Friends and Family: Not on file  . Attends Religious Services: Not on file  . Active Member of Clubs or Organizations: Not on file  . Attends Archivist Meetings: Not on file  . Marital Status: Not on file  Intimate Partner Violence:   . Fear of Current or Ex-Partner: Not on file  . Emotionally Abused: Not on file  . Physically Abused: Not on file  . Sexually Abused: Not on file   Current Outpatient Medications on File Prior to Visit  Medication Sig Dispense Refill  . amphetamine-dextroamphetamine (ADDERALL) 15 MG tablet Take 15 mg by mouth 3 (three) times daily.    Marland Kitchen b complex vitamins tablet Take by  mouth.    . cyclobenzaprine (FLEXERIL) 10 MG tablet Take 1 tablet (10 mg total) by mouth 3 (three) times daily as needed. 30 tablet 0  . diazepam (VALIUM) 5 MG tablet Take 1 tablet by mouth 3 (three) times daily.    . diclofenac Sodium (VOLTAREN) 1 % GEL APPLY AS DIRECTED TOPICALLY THREE TIMES DAILY    . methocarbamol (ROBAXIN) 500 MG tablet Take 1 tablet (500 mg total) by mouth 2 (two) times daily as needed for muscle spasms. 30 tablet 0  . medroxyPROGESTERone (PROVERA) 5 MG tablet Take 1 tablet (5 mg total) by mouth daily for 10 days. 1 tab PO daily x 10 days every 3 months. 10 tablet 4   No current facility-administered medications on file prior to visit.   No Known Allergies Family History  Problem  Relation Age of Onset  . Varicose Veins Mother   . Hypertension Mother   . Depression Mother   . Anxiety disorder Mother   . Diabetes Paternal Aunt   . Arthritis Maternal Grandmother   . Alzheimer's disease Maternal Grandmother   . Stroke Maternal Grandmother   . Cancer Maternal Grandfather        lung  . Schizophrenia Maternal Grandfather   . Schizophrenia Father   . Multiple sclerosis Maternal Aunt   . Colon cancer Maternal Aunt     PE: BP 120/70   Pulse 80   Ht 5\' 8"  (1.727 m)   Wt 227 lb (103 kg)   SpO2 98%   BMI 34.52 kg/m  Wt Readings from Last 3 Encounters:  03/14/19 227 lb (103 kg)  02/14/19 226 lb (102.5 kg)  11/08/18 206 lb (93.4 kg)   Constitutional: Obese, in NAD, no full supraclavicular fat pads Eyes: PERRLA, EOMI, no exophthalmos ENT: moist mucous membranes, no thyromegaly, no cervical lymphadenopathy Cardiovascular: RRR, No MRG Respiratory: CTA B Gastrointestinal: abdomen soft, NT, ND, BS+ Musculoskeletal: no deformities, strength intact in all 4 Skin: moist, warm; no acne on face, no dark terminal hair on chin, no acanthosis nigricans, no purple, wide, stretch marks Neurological: no tremor with outstretched hands, DTR normal in all 4  ASSESSMENT: 1.  Oligomenorrhea  2. Fatigue  PLAN: 1.  I had a long discussion with the patient about possible causes of oligomenorrhea.  She is not on any medicines that can cause this problem  She does not believe she is pregnant -had negative pregnancy test in the past while having oligomenorrhea  No galactorrhea to suggest hyperprolactinemia, and also also recent prolactin level from 10/2018 was normal  No hypothyroidism/hyperthyroidism per review of TSH from 10/2018.  We will repeat his labs today along with thyroid antibodies  to screen for Hashimoto's thyroiditis  She has had normal menses after menarche and also she responded well to Provera, so doubt outflow obstruction  She does not have other features of PCOS: acne, hirsutism, high testosterone, unclear if LH/FSH >2 since only an [facility] is available for review, + history of ovarian cysts per review of the chart, but not very recent ultrasound of her review of her ultrasound in 2015. We discussed about what the syndrome means.  Doubt [place]-CAH because no clear signs of hyperandrogenism, but we can check a 17 hydroxyprogesterone level to make sure  She does not have other autoimmune diseases to suggest autoimmune premature ovarian insufficiency, and also, her FSH was low. We will repeat this, and we may need to check adrenal antibodies since this is the best test to evaluate for ovarian autoimmunity (not ovarian antibodies, which are not specific enough).  I also would like to rule out Cushing syndrome-we will do a dexamethasone suppression test.  I explained the rationale for this. - no immediate plans for pregnancy - If the results are normal, continue Provera - We will see her back if the results are abnormal - If the results are normal, I am not sure where to direct her. POTS is a possibility, but fairly remote.  2.  Fatigue -No anemia per review of her CBC from 10/2018 -We will check thyroid function test and antibodies.  Discussed about the  possible use of selenium if the antibodies are positive. -We will also check a vitamin D and vitamin B12.  We will check the following labs today: Orders Placed This Encounter  Procedures  . TSH  .  T4, free  . T3, free  . Estradiol  . DHEA-Sulfate, Serum  . 17-Hydroxyprogesterone  . Testosterone Free with SHBG  . Luteinizing hormone  . Follicle stimulating hormone  . Thyroid peroxidase antibody  . Thyroglobulin antibody  . Vitamin B12  . VITAMIN D 25 Hydroxy (Vit-D Deficiency, Fractures)   For now I advised her to hold off using the Provera until the investigation is complete.  Office Visit on 03/14/2019  Component Date Value Ref Range Status  . TSH 03/14/2019 1.56  0.35 - 4.50 uIU/mL Final  . Free T4 03/14/2019 1.01  0.60 - 1.60 ng/dL Final   Comment: Specimens from patients who are undergoing biotin therapy and /or ingesting biotin supplements may contain high levels of biotin.  The higher biotin concentration in these specimens interferes with this Free T4 assay.  Specimens that contain high levels  of biotin may cause false high results for this Free T4 assay.  Please interpret results in light of the total clinical presentation of the patient.    . T3, Free 03/14/2019 3.2  2.3 - 4.2 pg/mL Final  . Estradiol, Free 03/14/2019 5.51  pg/mL Final   Comment: . FEMALE REFERENCE RANGES FOR ESTRADIOL, FREE: .   Follicular Stage:  0.43-5.03 pg/mL   Mid-cycle Stage:   0.72-5.89 pg/mL   Luteal Stage:      0.40-5.55 pg/mL   Postmenopausal:    < or = 0.38 pg/mL   . Estradiol 03/14/2019 276  pg/mL Final   Comment: . FEMALE REFERENCE RANGES FOR ESTRADIOL: .   Follicular Stage:  39-375 pg/mL   Mid-cycle Stage:   94-762 pg/mL   Luteal Stage:      48-440 pg/mL   Postmenopausal:    < or = 10 pg/mL . This test was developed and its analytical performance characteristics have been determined by [facility]. It has not been cleared or  approved by FDA. This assay has been validated pursuant to the CLIA regulations and is used for clinical purposes.   Marland Kitchen DHEA-Sulfate, LCMS 03/14/2019 202  ug/dL Final   Comment: This test was developed and its performance characteristics determined by [facility]. It has not been cleared or approved by the Food and Drug Administration. Reference Range: Adult Females (21 - 30y): 22 - 372   . 17-OH-Progesterone, LC/MS/MS 03/14/2019 20  ** ng/dL Final   Comment: . ** Unable to flag abnormal result(s), please refer     to reference range(s) below: . Adult Female Reference Ranges   for 17-Hydroxyprogesterone: .   Pre-Menopausal Mid Follicular:  23 - 102 ng/dL   Pre-Menopausal Surge:           67 - 349 ng/dL   Pre-Menopausal Mid Luteal:     139 - 431 ng/dL   Postmenopausal Phase:          < or = 45 ng/dL .       Female Tanner Stages: .   II - III Females:  18 - 220 ng/dL   IV - V   Females:  36 - 200 ng/dL . Marland Kitchen **Includes data from J Clin Endocrinol Metab.   [phone]; J Clin Endocrinol Metab.   [phone]; J Clin Endocrinol Metab.   1994;78:226-270. Pediatr Res 1988;23:525-529.   MedLinePlus (accessed 07/04/12). . This test was developed and its analytical performance characteristics have been determined by [facility], [place]. It has not been cleared or approved by the U.S. Food and Drug Administration. This  assay has been validated pursuant to the CLIA regulations and is [place]                          ed for clinical purposes. .   . Testosterone, Serum (Total) 03/14/2019 41  ng/dL Final   Comment: This test was developed and its performance characteristics determined by [facility]. It has not been cleared or approved by the Food and Drug Administration. Reference Range: Adult Females   Premenopausal  10 - 55   Postmenopausal  7 - 40   . % Free Testosterone 03/14/2019 0.9  % Final   Comment: This test was developed and its performance  characteristics determined by [facility]. It has not been cleared or approved by the Food and Drug Administration. Reference Range: Adult Females: 0.8 - 1.4   . Free Testosterone, S 03/14/2019 3.7  pg/mL Final   Comment: Reference Range: Adult Females: 1.1 - 6.3   . Sex Hormone Binding Globulin 03/14/2019 65.9  nmol/L Final   Comment: Reference Range: Pubertal: 36.0 - 125.0 20 - 49y: 24.6 - 122.0 >49y:     17.3 - 125.0   . LH 03/14/2019 4.46  mIU/mL Final   Comment: Female Reference Range:20-70 yrs     1.5-9.3 mIU/mL>70 yrs       3.1-35.6 mIU/mLFemale Reference Range:Follicular Phase     1.9-12.5 mIU/mLMidcycle             8.7-76.3 mIU/mLLuteal Phase         0.5-16.9 mIU/mL  Post Menopausal      15.9-54.0  mIU/mLPregnant             <1.5 mIU/mLContraceptives       0.7-5.6 mIU/mL   . [facility] 03/14/2019 3.8  mIU/ML Final   Female Reference Range:  1.4-18.1 mIU/mLFemale Reference Range:Follicular Phase          2.5-10.2 mIU/mLMidCycle Peak          3.4-33.4 mIU/mLLuteal Phase          1.5-9.1 mIU/mLPost Menopausal     23.0-116.3 mIU/mLPregnant          <0.3 mIU/mL  . Thyroperoxidase Ab SerPl-aCnc 03/14/2019 <1  <9 IU/mL Final  . Thyroglobulin Ab 03/14/2019 <1  < or = 1 IU/mL Final  . Vitamin B-12 03/14/2019 377  211 - 911 pg/mL Final  . VITD 03/14/2019 30.45  30.00 - 100.00 ng/mL Final   All the above tests are normal.  No signs of CAH, POI, Hashimoto's thyroiditis or other thyroid disease, hyperandrogenism, vitamin D or B12 deficiency.  Dexamethasone suppression test was also normal, ruling out Cushing syndrome: Cortisol appropriately suppressed to 0.8.  The only other test that I can think about checking is a cosyntropin stimulation test to check for adrenal insufficiency.  I will have her back for this.  Carlus Pavlov, MD PhD [facility]

## 2019-03-19 ENCOUNTER — Encounter (HOSPITAL_COMMUNITY): Payer: Self-pay | Admitting: Emergency Medicine

## 2019-03-19 ENCOUNTER — Other Ambulatory Visit: Payer: Self-pay

## 2019-03-19 ENCOUNTER — Emergency Department (HOSPITAL_COMMUNITY)
Admission: EM | Admit: 2019-03-19 | Discharge: 2019-03-19 | Disposition: A | Discharge status: Home / Self Care | Payer: 59 | Attending: Emergency Medicine | Admitting: Emergency Medicine

## 2019-03-19 DIAGNOSIS — Z79899 Other long term (current) drug therapy: Secondary | ICD-10-CM | POA: Diagnosis not present

## 2019-03-19 DIAGNOSIS — R5383 Other fatigue: Secondary | ICD-10-CM | POA: Diagnosis present

## 2019-03-19 DIAGNOSIS — Z87891 Personal history of nicotine dependence: Secondary | ICD-10-CM | POA: Diagnosis not present

## 2019-03-19 DIAGNOSIS — R531 Weakness: Secondary | ICD-10-CM | POA: Diagnosis not present

## 2019-03-19 LAB — CBC WITH DIFFERENTIAL/PLATELET
Abs Immature Granulocytes: 0.01 10*3/uL (ref 0.00–0.07)
Basophils Absolute: 0.1 10*3/uL (ref 0.0–0.1)
Basophils Relative: 1 %
Eosinophils Absolute: 0.4 10*3/uL (ref 0.0–0.5)
Eosinophils Relative: 7 %
HCT: 36.8 % (ref 36.0–46.0)
Hemoglobin: 12.6 g/dL (ref 12.0–15.0)
Immature Granulocytes: 0 %
Lymphocytes Relative: 37 %
Lymphs Abs: 2.1 10*3/uL (ref 0.7–4.0)
MCH: 34.2 pg — ABNORMAL HIGH (ref 26.0–34.0)
MCHC: 34.2 g/dL (ref 30.0–36.0)
MCV: 100 fL (ref 80.0–100.0)
Monocytes Absolute: 0.6 10*3/uL (ref 0.1–1.0)
Monocytes Relative: 10 %
Neutro Abs: 2.6 10*3/uL (ref 1.7–7.7)
Neutrophils Relative %: 45 %
Platelets: 276 10*3/uL (ref 150–400)
RBC: 3.68 MIL/uL — ABNORMAL LOW (ref 3.87–5.11)
RDW: 11.9 % (ref 11.5–15.5)
WBC: 5.8 10*3/uL (ref 4.0–10.5)
nRBC: 0 % (ref 0.0–0.2)

## 2019-03-19 LAB — COMPREHENSIVE METABOLIC PANEL
ALT: 20 U/L (ref 0–44)
AST: 24 U/L (ref 15–41)
Albumin: 3.6 g/dL (ref 3.5–5.0)
Alkaline Phosphatase: 42 U/L (ref 38–126)
Anion gap: 9 (ref 5–15)
BUN: 12 mg/dL (ref 6–20)
CO2: 24 mmol/L (ref 22–32)
Calcium: 8.6 mg/dL — ABNORMAL LOW (ref 8.9–10.3)
Chloride: 106 mmol/L (ref 98–111)
Creatinine, Ser: 0.74 mg/dL (ref 0.44–1.00)
GFR calc Af Amer: >60 mL/min (ref >60)
GFR calc non Af Amer: >60 mL/min (ref >60)
Glucose, Bld: 95 mg/dL (ref 70–99)
Potassium: 3.9 mmol/L (ref 3.5–5.1)
Sodium: 139 mmol/L (ref 135–145)
Total Bilirubin: 0.6 mg/dL (ref 0.3–1.2)
Total Protein: 6.2 g/dL — ABNORMAL LOW (ref 6.5–8.1)

## 2019-03-19 LAB — TSH: TSH: 1.412 u[IU]/mL (ref 0.350–4.500)

## 2019-03-19 LAB — ETHANOL: Alcohol, Ethyl (B): <10 mg/dL (ref <10)

## 2019-03-19 LAB — MAGNESIUM: Magnesium: 1.8 mg/dL (ref 1.7–2.4)

## 2019-03-19 LAB — URINALYSIS, ROUTINE W REFLEX MICROSCOPIC
Bilirubin Urine: NEGATIVE
Glucose, UA: NEGATIVE mg/dL
Hgb urine dipstick: NEGATIVE
Ketones, ur: NEGATIVE mg/dL
Leukocytes,Ua: NEGATIVE
Nitrite: NEGATIVE
Protein, ur: NEGATIVE mg/dL
Specific Gravity, Urine: 1.013 (ref 1.005–1.030)
pH: 6 (ref 5.0–8.0)

## 2019-03-19 LAB — LIPASE, BLOOD: Lipase: 24 U/L (ref 11–51)

## 2019-03-19 LAB — T4, FREE: Free T4: 0.98 ng/dL (ref 0.61–1.12)

## 2019-03-19 NOTE — ED Triage Notes (Signed)
Pt arrives to ED from home with complaints of RLQ abdominal pain that radiates to her lower back x2 days. Patient also states starting three days ago she felt confused and since she has been fatigued and sleeping about 20 hours per day since she stated she was confused. Patient states she has not been eating or drinking over the last three days.

## 2019-03-19 NOTE — Discharge Instructions (Addendum)
As discussed, your evaluation today has been largely reassuring.  But, it is important that you monitor your condition carefully, and do not hesitate to return to the ED if you develop new, or concerning changes in your condition. ? ?Otherwise, please follow-up with your physician for appropriate ongoing care. ? ?

## 2019-03-19 NOTE — ED Provider Notes (Signed)
[facility] EMERGENCY DEPARTMENT Provider Note   CSN: [id number] Arrival date & time: 03/19/19  1800     History Chief Complaint  Patient presents with  . Abdominal Pain  . Fatigue    Kayla Wilkins is a 29 y.o. female.  HPI    Patient presents with concern of ongoing fatigue, hyper somnolence. Patient acknowledges that her illness has been going on for months, possibly longer, but states that over the past 3 days or so she has had increasing generalized fatigue, weakness, discomfort, without focal pain, without focal disability. She has been seen and evaluated by her endocrinologist, and her gynecologist in the past weeks, has had no recent change medication.  With worsening symptoms over the past 3 days she presents for evaluation. Past Medical History:  Diagnosis Date  . Bilateral ovarian cysts   . Dyspareunia in female 08/21/2015  . Hair loss 08/21/2015  . Irregular periods 08/21/2015  . Irritable 08/21/2015  . Menorrhagia   . Mental disorder   . Urinary urgency 08/21/2015  . Vaginal discharge 08/21/2015    Patient Active Problem List   Diagnosis Date Noted  . Libido, decreased 11/03/2018  . Amenorrhea 11/03/2018  . Chiari malformation type I (HCC) 09/16/2018  . Vaginal discharge 08/21/2015  . Urinary urgency 08/21/2015  . Irregular periods 08/21/2015  . Dyspareunia in female 08/21/2015  . Hair loss 08/21/2015  . Irritable 08/21/2015  . Bacterial vaginitis 06/02/2013  . Elevated BP 02/15/2013  . Severe obesity (BMI >= 40) (HCC) 02/15/2013  . Malpositioned IUD 02/08/2013  . H/O menorrhagia 02/08/2013    Past Surgical History:  Procedure Laterality Date  . CHOLECYSTECTOMY    . GALLBLADDER SURGERY  2009  . vertical sleeve   01/14/2015     OB History    Gravida  0   Para  0   Term  0   Preterm  0   AB  0   Living  0     SAB  0   TAB  0   Ectopic  0   Multiple  0   Live Births  0           Family History  Problem  Relation Age of Onset  . Varicose Veins Mother   . Hypertension Mother   . Depression Mother   . Anxiety disorder Mother   . Diabetes Paternal Aunt   . Arthritis Maternal Grandmother   . Alzheimer's disease Maternal Grandmother   . Stroke Maternal Grandmother   . Cancer Maternal Grandfather        lung  . Schizophrenia Maternal Grandfather   . Schizophrenia Father   . Multiple sclerosis Maternal Aunt   . Colon cancer Maternal Aunt     Social History   Tobacco Use  . Smoking status: Former Smoker    Types: Cigarettes  . Smokeless tobacco: Never Used  Substance Use Topics  . Alcohol use: Yes    Comment: WINE WITH DINNER  . Drug use: No    Home Medications Prior to Admission medications   Medication Sig Start Date End Date Taking? Authorizing Provider  amphetamine-dextroamphetamine (ADDERALL) 15 MG tablet Take 15 mg by mouth 3 (three) times daily.    [provider]  b complex vitamins tablet Take by mouth.    [provider]  cyclobenzaprine (FLEXERIL) 10 MG tablet Take 1 tablet (10 mg total) by mouth 3 (three) times daily as needed. 09/20/18   Myra Rude, MD  dexamethasone (  DECADRON) 1 MG tablet Take 1 tablet by mouth once at 11 pm, before coming for labs at 8 am the next morning 03/14/19   Philemon Kingdom, MD  diazepam (VALIUM) 5 MG tablet Take 1 tablet by mouth 3 (three) times daily. 01/19/18   [provider]  diclofenac Sodium (VOLTAREN) 1 % GEL APPLY AS DIRECTED TOPICALLY THREE TIMES DAILY 07/26/17   [provider]  medroxyPROGESTERone (PROVERA) 5 MG tablet Take 1 tablet (5 mg total) by mouth daily for 10 days. 1 tab PO daily x 10 days every 3 months. 12/29/18 01/08/19  Princess Bruins, MD  methocarbamol (ROBAXIN) 500 MG tablet Take 1 tablet (500 mg total) by mouth 2 (two) times daily as needed for muscle spasms. 10/25/18   Rosemarie Ax, MD    Allergies    Patient has no known allergies.  Review of Systems   Review of  Systems  Constitutional:       Per HPI, otherwise negative  HENT:       Per HPI, otherwise negative  Respiratory:       Per HPI, otherwise negative  Cardiovascular:       Per HPI, otherwise negative  Gastrointestinal: Negative for vomiting.  Endocrine:       Negative aside from HPI  Genitourinary:       Neg aside from HPI   Musculoskeletal: Positive for myalgias.  Skin: Negative for rash.  Allergic/Immunologic: Negative for immunocompromised state.  Neurological: Positive for weakness. Negative for syncope.  Hematological: Negative.   Psychiatric/Behavioral: Positive for confusion and decreased concentration.    Physical Exam Updated Vital Signs BP (!) 102/53   Pulse (!) 57   Temp 97.8 F (36.6 C) (Oral)   Resp 18   Wt 106.6 kg   SpO2 99%   BMI 35.73 kg/m   Physical Exam Vitals and nursing note reviewed.  Constitutional:      General: She is not in acute distress.    Appearance: She is well-developed.  HENT:     Head: Normocephalic and atraumatic.  Eyes:     Conjunctiva/sclera: Conjunctivae normal.  Cardiovascular:     Rate and Rhythm: Normal rate and regular rhythm.  Pulmonary:     Effort: Pulmonary effort is normal. No respiratory distress.     Breath sounds: Normal breath sounds. No stridor.  Abdominal:     General: There is no distension.  Skin:    General: Skin is warm and dry.  Neurological:     Mental Status: She is alert and oriented to person, place, and time.     Cranial Nerves: No cranial nerve deficit.     ED Results / Procedures / Treatments   Labs (all labs ordered are listed, but only abnormal results are displayed) Labs Reviewed  COMPREHENSIVE METABOLIC PANEL - Abnormal; Notable for the following components:      Result Value   Calcium 8.6 (*)    Total Protein 6.2 (*)    All other components within normal limits  CBC WITH DIFFERENTIAL/PLATELET - Abnormal; Notable for the following components:   RBC 3.68 (*)    MCH 34.2 (*)    All  other components within normal limits  URINALYSIS, ROUTINE W REFLEX MICROSCOPIC - Abnormal; Notable for the following components:   Color, Urine STRAW (*)    All other components within normal limits  ETHANOL  LIPASE, BLOOD  MAGNESIUM  T4, FREE  TSH    EKG EKG Interpretation  Date/Time:  Sunday March 19 2019  18:02:44 EST Ventricular Rate:  62 PR Interval:  144 QRS Duration: 92 QT Interval:  394 QTC Calculation: 399 R Axis:   77 Text Interpretation: Normal sinus rhythm Baseline wander Abnormal ekg Confirmed by Gerhard Munch [phone]) on 03/19/2019 8:16:40 PM   Radiology No results found.  Procedures Procedures (including critical care time)  Medications Ordered in ED Medications - No data to display  ED Course  I have reviewed the triage vital signs and the nursing notes.  Pertinent labs & imaging results that were available during my care of the patient were reviewed by me and considered in my medical decision making (see chart for details).   Initial evaluation I read the patient's chart including documentation from recent gynecology visit.  Patient has also seen her endocrinologist, this note is also reviewed.  Multiple labs sent, ongoing evaluation with each of these services.  10:14 PM Patient awake, alert, sitting upright, in no distress per Lengthy conversation about all findings, and I reviewed the labs and ongoing studies that I found on investigation of her chart, including ongoing consideration for adrenal dysfunction, or other endocrinologic pathology. With no alarming findings here, no evidence for acute new pathology, no negation for admission.  Patient discharged with close outpatient follow-up. Final Clinical Impression(s) / ED Diagnoses Final diagnoses:  Weakness      Gerhard Munch, MD 03/19/19 2215

## 2019-03-21 ENCOUNTER — Other Ambulatory Visit: Payer: Self-pay

## 2019-03-21 LAB — TESTOSTERONE, FREE AND TOTAL (INCLUDES SHBG)-(MALES)
% Free Testosterone: 0.9 %
Free Testosterone, S: 3.7 pg/mL
Sex Hormone Binding Globulin: 65.9 nmol/L
Testosterone, Serum (Total): 41 ng/dL

## 2019-03-21 LAB — DHEA-SULFATE, SERUM: DHEA-Sulfate, LCMS: 202 ug/dL

## 2019-03-22 ENCOUNTER — Telehealth: Payer: Self-pay

## 2019-03-22 ENCOUNTER — Other Ambulatory Visit: Payer: Self-pay | Admitting: Internal Medicine

## 2019-03-22 DIAGNOSIS — N915 Oligomenorrhea, unspecified: Secondary | ICD-10-CM

## 2019-03-22 DIAGNOSIS — R5383 Other fatigue: Secondary | ICD-10-CM

## 2019-03-22 LAB — THYROGLOBULIN ANTIBODY: Thyroglobulin Ab: <1 IU/mL (ref <=1)

## 2019-03-22 LAB — THYROID PEROXIDASE ANTIBODY: Thyroperoxidase Ab SerPl-aCnc: <1 IU/mL (ref <9)

## 2019-03-22 LAB — ESTRADIOL, FREE
Estradiol, Free: 5.51 pg/mL
Estradiol: 276 pg/mL

## 2019-03-22 LAB — 17-HYDROXYPROGESTERONE: 17-OH-Progesterone, LC/MS/MS: 20 ng/dL

## 2019-03-22 NOTE — Telephone Encounter (Signed)
pts appt has been cancelled for tomorrow but she is also set for labs on 03/24/19 is that to be cancelled as well? It was set up at the end of february

## 2019-03-22 NOTE — Telephone Encounter (Signed)
Per Dr. Elvera Lennox, patient is on the schedule for tomorrow 03/23/19  Dr. Elvera Lennox said this needs to be canceled because she was just seen 2 weeks ago and her lab tests are not all done yet.  Please contact patient to cancel.

## 2019-03-23 ENCOUNTER — Ambulatory Visit: Payer: 59 | Admitting: Internal Medicine

## 2019-03-23 NOTE — Telephone Encounter (Signed)
OK well she is all set then

## 2019-03-23 NOTE — Telephone Encounter (Signed)
No she needs to do the lab that day.

## 2019-03-24 ENCOUNTER — Other Ambulatory Visit (INDEPENDENT_AMBULATORY_CARE_PROVIDER_SITE_OTHER): Payer: 59

## 2019-03-24 ENCOUNTER — Other Ambulatory Visit: Payer: Self-pay

## 2019-03-24 DIAGNOSIS — N915 Oligomenorrhea, unspecified: Secondary | ICD-10-CM

## 2019-03-24 DIAGNOSIS — R5383 Other fatigue: Secondary | ICD-10-CM

## 2019-03-28 ENCOUNTER — Telehealth: Payer: Self-pay

## 2019-03-28 LAB — CORTISOL-AM, BLOOD: Cortisol - AM: 0.8 ug/dL — ABNORMAL LOW

## 2019-03-28 LAB — DEXAMETHASONE, BLOOD: Dexamethasone, Serum: 159 ng/dL

## 2019-03-28 NOTE — Telephone Encounter (Signed)
Patient got our answering service when she called and this is the note we were sent on this call:"caller states she has been experiencing a whole bunch of symptoms for a couple of weeks. She was hospitalized and has been trying to set up sn appointment. She is still experiencing symptoms of disorientation,dizziness,nausea,confused,constant fatigue and abdominal pain for more than 20 hours. Caller reports general weakness (over the past few weeks) abdomnal pain; 5(0-10) radiates lower back and flank-please advise and patient would like a call to discuss

## 2019-03-28 NOTE — Telephone Encounter (Signed)
M, her current endocrinologic investigation is normal.  Please tell her to contact PCP.

## 2019-03-29 ENCOUNTER — Encounter: Payer: Self-pay | Admitting: Internal Medicine

## 2019-03-29 NOTE — Telephone Encounter (Signed)
LM for patient.  Chart shows PCP office trying to reach out to her for ER follow up.

## 2019-03-30 ENCOUNTER — Other Ambulatory Visit (INDEPENDENT_AMBULATORY_CARE_PROVIDER_SITE_OTHER): Payer: 59

## 2019-03-30 ENCOUNTER — Other Ambulatory Visit: Payer: Self-pay

## 2019-03-30 ENCOUNTER — Ambulatory Visit (INDEPENDENT_AMBULATORY_CARE_PROVIDER_SITE_OTHER): Payer: 59

## 2019-03-30 ENCOUNTER — Encounter: Payer: Self-pay | Admitting: Internal Medicine

## 2019-03-30 DIAGNOSIS — N915 Oligomenorrhea, unspecified: Secondary | ICD-10-CM | POA: Diagnosis not present

## 2019-03-30 DIAGNOSIS — R5383 Other fatigue: Secondary | ICD-10-CM

## 2019-03-30 MED ORDER — COSYNTROPIN 0.25 MG IJ SOLR
0.2500 mg | Freq: Once | INTRAMUSCULAR | Status: AC
  Administered 2019-03-30: 16:00:00 0.25 mg via INTRAMUSCULAR

## 2019-03-30 NOTE — Progress Notes (Signed)
Per orders of Dr. Gherghe injection of Cortrosyn given today by Muad Noga, Certified Medical Assistant. Patient tolerated injection well.  

## 2019-03-31 LAB — CORTISOL
Cortisol, Plasma: 9.1 ug/dL
Cortisol, Plasma: 19.4 ug/dL
Cortisol, Plasma: 25.9 ug/dL

## 2019-04-01 ENCOUNTER — Encounter: Payer: Self-pay | Admitting: Internal Medicine

## 2019-04-03 LAB — ACTH: C206 ACTH: <5 pg/mL — ABNORMAL LOW (ref 6–50)

## 2019-04-06 ENCOUNTER — Telehealth: Payer: Self-pay | Admitting: Family Medicine

## 2019-04-06 NOTE — Telephone Encounter (Signed)
Looks like she will need a virtual to discuss.

## 2019-04-06 NOTE — Telephone Encounter (Signed)
Pt scheduled an appt through mychart but the notes she put were to long for me to add any notes so I just copied the rest of what pt said and I will post here.   Per pt: I also have an FMLA packet that I need filled out for work if possible. Due to gradual worsening of symptoms while seeking diagnosis over the course of the past year, I've had to take multiple sick days. I'm getting worse. Until a diagnosis and treatment plan can be figured out, FMLA coverage at work will at least keep me employed and insured....   Appointment is scheduled 04/07/19 at 1pm

## 2019-04-06 NOTE — Telephone Encounter (Signed)
I tried calling and left voice message, I also sent a mychart message asking her to give [place] a call so we can get more info to determine what kind of appointment this needs to be. Waiting on response.

## 2019-04-07 ENCOUNTER — Ambulatory Visit: Payer: 59 | Admitting: Family Medicine

## 2019-04-07 DIAGNOSIS — Z0289 Encounter for other administrative examinations: Secondary | ICD-10-CM

## 2019-04-19 ENCOUNTER — Encounter (HOSPITAL_COMMUNITY): Payer: Self-pay | Admitting: Emergency Medicine

## 2019-04-19 ENCOUNTER — Emergency Department (HOSPITAL_COMMUNITY): Payer: 59

## 2019-04-19 ENCOUNTER — Emergency Department (HOSPITAL_COMMUNITY)
Admission: EM | Admit: 2019-04-19 | Discharge: 2019-04-19 | Disposition: A | Discharge status: Home / Self Care | Payer: 59 | Attending: Emergency Medicine | Admitting: Emergency Medicine

## 2019-04-19 DIAGNOSIS — Z87891 Personal history of nicotine dependence: Secondary | ICD-10-CM | POA: Insufficient documentation

## 2019-04-19 DIAGNOSIS — R531 Weakness: Secondary | ICD-10-CM | POA: Diagnosis not present

## 2019-04-19 DIAGNOSIS — Z79899 Other long term (current) drug therapy: Secondary | ICD-10-CM | POA: Diagnosis not present

## 2019-04-19 DIAGNOSIS — R519 Headache, unspecified: Secondary | ICD-10-CM | POA: Diagnosis not present

## 2019-04-19 DIAGNOSIS — R5383 Other fatigue: Secondary | ICD-10-CM | POA: Diagnosis not present

## 2019-04-19 DIAGNOSIS — R200 Anesthesia of skin: Secondary | ICD-10-CM | POA: Insufficient documentation

## 2019-04-19 LAB — CBG MONITORING, ED: Glucose-Capillary: 97 mg/dL (ref 70–99)

## 2019-04-19 NOTE — Discharge Instructions (Addendum)
Please schedule a follow-up appointment with both your primary doctor as well as your neurologist.  Please discuss the symptoms you are experiencing today.  For any headache recommend Tylenol and Motrin as needed.  If you develop any recurrent numbness, weakness, vision changes, please return to ER for reassessment.

## 2019-04-19 NOTE — ED Provider Notes (Signed)
[facility] EMERGENCY DEPARTMENT Provider Note   CSN: [id number] Arrival date & time: 04/19/19  1750     History No chief complaint on file.   Kayla Wilkins is a 29 y.o. female.  Presents to ER with multiple complaints.  Most concerned about a headache that started has been going on intermittently lately.  This morning started behind her right eye, around the right side of her head.  She also briefly felt like her right arm and right leg were weak.  This lasted for a few minutes and resolved spontaneously this morning.  Her headache has since resolved.  This was not her worst headache of her life, not sudden onset.  She has no numbness or weakness, no vision changes.  Never had any pain in the eye itself just felt the sensation of pressure behind her eye.  Patient has been struggling with myriad medical complaints over the past few months to couple years.  She states that she has issues with severe fatigue, headaches, has previously had episodes of transient numbness/weakness, amenorrhea.  Brain MRI with and without contrast September 2020 showed Chiari malformation but no acute findings.  Patient has been seen by gynecology, endocrinology, neurology.  Reports no one can find out what is wrong with her.  HPI     Past Medical History:  Diagnosis Date  . Bilateral ovarian cysts   . Dyspareunia in female 08/21/2015  . Hair loss 08/21/2015  . Irregular periods 08/21/2015  . Irritable 08/21/2015  . Menorrhagia   . Mental disorder   . Urinary urgency 08/21/2015  . Vaginal discharge 08/21/2015    Patient Active Problem List   Diagnosis Date Noted  . Libido, decreased 11/03/2018  . Amenorrhea 11/03/2018  . 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 Vaginal discharge 08/21/2015  . Urinary urgency 08/21/2015  . Irregular periods 08/21/2015  . Dyspareunia in female 08/21/2015  . Hair loss 08/21/2015  . Irritable 08/21/2015  . Bacterial vaginitis 06/02/2013  . Elevated BP  02/15/2013  . Severe obesity (BMI >= 40) ([place]) 02/15/2013  . Malpositioned IUD 02/08/2013  . H/O menorrhagia 02/08/2013    Past Surgical History:  Procedure Laterality Date  . CHOLECYSTECTOMY    . GALLBLADDER SURGERY  2009  . vertical sleeve   01/14/2015     OB History    Gravida  0   Para  0   Term  0   Preterm  0   AB  0   Living  0     SAB  0   TAB  0   Ectopic  0   Multiple  0   Live Births  0           Family History  Problem Relation Age of Onset  . Varicose Veins Mother   . Hypertension Mother   . Depression Mother   . Anxiety disorder Mother   . Diabetes Paternal Aunt   . Arthritis Maternal Grandmother   . Alzheimer's disease Maternal Grandmother   . Stroke Maternal Grandmother   . Cancer Maternal Grandfather        lung  . Schizophrenia Maternal Grandfather   . Schizophrenia Father   . Multiple sclerosis Maternal Aunt   . Colon cancer Maternal Aunt     Social History   Tobacco Use  . Smoking status: Former Smoker    Types: Cigarettes  . Smokeless tobacco: Never Used  Substance Use Topics  . Alcohol use: Yes  Comment: WINE WITH DINNER  . Drug use: No    Home Medications Prior to Admission medications   Medication Sig Start Date End Date Taking? Authorizing Provider  amphetamine-dextroamphetamine (ADDERALL) 15 MG tablet Take 15 mg by mouth 3 (three) times daily.    [provider]  b complex vitamins tablet Take by mouth.    [provider]  cyclobenzaprine (FLEXERIL) 10 MG tablet Take 1 tablet (10 mg total) by mouth 3 (three) times daily as needed. 09/20/18   Rosemarie Ax, MD  dexamethasone (DECADRON) 1 MG tablet Take 1 tablet by mouth once at 11 pm, before coming for labs at 8 am the next morning 03/14/19   Philemon Kingdom, MD  diazepam (VALIUM) 5 MG tablet Take 1 tablet by mouth 3 (three) times daily. 01/19/18   [provider]  diclofenac Sodium (VOLTAREN) 1 % GEL APPLY AS DIRECTED TOPICALLY  THREE TIMES DAILY 07/26/17   [provider]  medroxyPROGESTERone (PROVERA) 5 MG tablet Take 1 tablet (5 mg total) by mouth daily for 10 days. 1 tab PO daily x 10 days every 3 months. 12/29/18 01/08/19  Princess Bruins, MD  methocarbamol (ROBAXIN) 500 MG tablet Take 1 tablet (500 mg total) by mouth 2 (two) times daily as needed for muscle spasms. 10/25/18   Rosemarie Ax, MD    Allergies    Patient has no known allergies.  Review of Systems   Review of Systems  Constitutional: Negative for chills and fever.  HENT: Negative for ear pain and sore throat.   Eyes: Negative for pain and visual disturbance.  Respiratory: Negative for cough and shortness of breath.   Cardiovascular: Negative for chest pain and palpitations.  Gastrointestinal: Negative for abdominal pain and vomiting.  Genitourinary: Negative for dysuria and hematuria.  Musculoskeletal: Negative for arthralgias and back pain.  Skin: Negative for color change and rash.  Neurological: Positive for headaches. Negative for seizures and syncope.  All other systems reviewed and are negative.   Physical Exam Updated Vital Signs BP 125/73   Pulse 76   Temp 98.4 F (36.9 C) (Oral)   Resp 17   Ht _0  (1.727 m)   Wt 104.3 kg   SpO2 99%   BMI 34.97 kg/m   Physical Exam Vitals and nursing note reviewed.  Constitutional:      General: She is not in acute distress.    Appearance: She is well-developed.  HENT:     Head: Normocephalic and atraumatic.  Eyes:     Conjunctiva/sclera: Conjunctivae normal.  Cardiovascular:     Rate and Rhythm: Normal rate and regular rhythm.     Heart sounds: No murmur.  Pulmonary:     Effort: Pulmonary effort is normal. No respiratory distress.     Breath sounds: Normal breath sounds.  Abdominal:     Palpations: Abdomen is soft.     Tenderness: There is no abdominal tenderness.  Musculoskeletal:        General: No deformity or signs of injury.     Cervical back: Neck supple.   Skin:    General: Skin is warm and dry.  Neurological:     Mental Status: She is alert.     Comments: Awake, alert, oriented x3, cranial nerves II through XII intact, normal finger-nose-finger, 5 out of 5 strength in bilateral upper and lower extremities, sensation to light touch intact in bilateral upper and lower extremities, normal gait  Psychiatric:        Mood and  Affect: Mood normal.        Behavior: Behavior normal.     ED Results / Procedures / Treatments   Labs (all labs ordered are listed, but only abnormal results are displayed) Labs Reviewed  CBG MONITORING, ED    EKG None  Radiology CT Head Wo Contrast  Result Date: 04/19/2019 CLINICAL DATA:  Headache, migraine behind right eye EXAM: CT HEAD WITHOUT CONTRAST TECHNIQUE: Contiguous axial images were obtained from the base of the skull through the vertex without intravenous contrast. COMPARISON:  MRI brain dated 10/14/2018 FINDINGS: Brain: No evidence of acute infarction, hemorrhage, hydrocephalus, extra-axial collection or mass lesion/mass effect. Low-lying cerebellar tonsils, better evaluated on MRI. Vascular: No hyperdense vessel or unexpected calcification. Skull: Normal. Negative for fracture or focal lesion. Sinuses/Orbits: The visualized paranasal sinuses are essentially clear. The mastoid air cells are unopacified. Other: None. IMPRESSION: Low-lying cerebellar tonsils, corresponding to known Chiari malformation on prior MRI. Otherwise normal head CT. Electronically Signed   By: Julian Hy M.D.   On: 04/19/2019 19:29    Procedures Procedures (including critical care time)  Medications Ordered in ED Medications - No data to display  ED Course  I have reviewed the triage vital signs and the nursing notes.  Pertinent labs & imaging results that were available during my care of the patient were reviewed by me and considered in my medical decision making (see chart for details).    MDM  Rules/Calculators/A&P                      29 year old female presents to ER with complaint of headache.  Since then ER headache had resolved.  She had reported associated transient weakness in her right arm but this had completely resolved prior to arrival in the ER.  On my exam she has no focal neurologic deficits.  CT scan of her head was negative.  My suspicion for new acute neurologic process today is very low.  Suspect most likely patient is experiencing complex migraine.  Patient is already established with neurology in the outpatient setting.  Recommended that she have close follow-up in their clinic.  Reviewed return precautions, discharged home.    After the discussed management above, the patient was determined to be safe for discharge.  The patient was in agreement with this plan and all questions regarding their care were answered.  ED return precautions were discussed and the patient will return to the ED with any significant worsening of condition.   Final Clinical Impression(s) / ED Diagnoses Final diagnoses:  Nonintractable headache, unspecified chronicity pattern, unspecified headache type    Rx / DC Orders ED Discharge Orders    None       Lucrezia Starch, MD 04/20/19 1524

## 2019-04-19 NOTE — ED Triage Notes (Signed)
Pt here from home with c/o migraine behind the right eye had some weakness down the right side which has resolved

## 2019-04-25 ENCOUNTER — Encounter: Payer: Self-pay | Admitting: Family Medicine

## 2019-04-25 ENCOUNTER — Ambulatory Visit: Payer: 59 | Admitting: Family Medicine

## 2019-04-25 ENCOUNTER — Other Ambulatory Visit: Payer: Self-pay

## 2019-04-25 VITALS — BP 114/62 | HR 91 | Temp 98.2°F | Ht 68.0 in | Wt 236.6 lb

## 2019-04-25 DIAGNOSIS — R202 Paresthesia of skin: Secondary | ICD-10-CM

## 2019-04-25 DIAGNOSIS — R002 Palpitations: Secondary | ICD-10-CM | POA: Diagnosis not present

## 2019-04-25 DIAGNOSIS — M791 Myalgia, unspecified site: Secondary | ICD-10-CM | POA: Diagnosis not present

## 2019-04-25 DIAGNOSIS — F450 Somatization disorder: Secondary | ICD-10-CM

## 2019-04-25 DIAGNOSIS — M2550 Pain in unspecified joint: Secondary | ICD-10-CM | POA: Diagnosis not present

## 2019-04-25 NOTE — Progress Notes (Signed)
Established Patient Office Visit  Subjective:  Patient ID: Kayla Wilkins, female    DOB: 04-Jun-1990  Age: 29 y.o. MRN: [id number]  CC:  Chief Complaint  Patient presents with  . Hospitalization Follow-up    Abdominal pain, possible stroke symptoms per patient    HPI Kayla Wilkins presents for follow-up of her ongoing conglomeration of multiple signs and symptoms.  She continues with multiple joint pains involving her neck lower back hips knees and ankles.  She continues to experience migratory paresthesias and weakness that are right-sided only.  She continues to experience shooting nerve pains with muscle spasms.  She continues to experience fatigue.  She has been experiencing more frequent palpitations involving her heart.  She continues to gain weight.  MRI of her brain obtained in September of this past year was normal.  Review of her labs show normal sedimentation rate, CRP and a negative rheumatoid factor.  She has follow-up scheduled with her neurologist on Tuesday.  She is also seeing a psychiatrist, Sharon Seller at [facility] on [facility] for medication therapy CBT has been recommended but she has not had time to pursue that recommendation.  Past Medical History:  Diagnosis Date  . Bilateral ovarian cysts   . Dyspareunia in female 08/21/2015  . Hair loss 08/21/2015  . Irregular periods 08/21/2015  . Irritable 08/21/2015  . Menorrhagia   . Mental disorder   . Urinary urgency 08/21/2015  . Vaginal discharge 08/21/2015    Past Surgical History:  Procedure Laterality Date  . CHOLECYSTECTOMY    . GALLBLADDER SURGERY  2009  . vertical sleeve   01/14/2015    Family History  Problem Relation Age of Onset  . Varicose Veins Mother   . Hypertension Mother   . Depression Mother   . Anxiety disorder Mother   . Diabetes Paternal Aunt   . Arthritis Maternal Grandmother   . Alzheimer's disease Maternal Grandmother   . Stroke Maternal Grandmother   . Cancer Maternal  Grandfather        lung  . Schizophrenia Maternal Grandfather   . Schizophrenia Father   . Multiple sclerosis Maternal Aunt   . Colon cancer Maternal Aunt     Social History   Socioeconomic History  . Marital status: Divorced    Spouse name: Not on file  . Number of children: Not on file  . Years of education: Not on file  . Highest education level: Not on file  Occupational History  . Not on file  Tobacco Use  . Smoking status: Former Smoker    Types: Cigarettes  . Smokeless tobacco: Never Used  Substance and Sexual Activity  . Alcohol use: Yes    Comment: WINE WITH DINNER  . Drug use: No  . Sexual activity: Yes    Partners: Male    Birth control/protection: Condom    Comment: 1st intercourse- 59, partners- 50, divorced,  Other Topics Concern  . Not on file  Social History Narrative  . Not on file   Social Determinants of Health   Financial Resource Strain:   . Difficulty of Paying Living Expenses:   Food Insecurity:   . Worried About Charity fundraiser in the Last Year:   . Arboriculturist in the Last Year:   Transportation Needs:   . Film/video editor (Medical):   Marland Kitchen Lack of Transportation (Non-Medical):   Physical Activity:   . Days of Exercise per Week:   . Minutes of Exercise per  Session:   Stress:   . Feeling of Stress :   Social Connections:   . Frequency of Communication with Friends and Family:   . Frequency of Social Gatherings with Friends and Family:   . Attends Religious Services:   . Active Member of Clubs or Organizations:   . Attends Banker Meetings:   Marland Kitchen Marital Status:   Intimate Partner Violence:   . Fear of Current or Ex-Partner:   . Emotionally Abused:   Marland Kitchen Physically Abused:   . Sexually Abused:     Outpatient Medications Prior to Visit  Medication Sig Dispense Refill  . amphetamine-dextroamphetamine (ADDERALL) 15 MG tablet Take 15 mg by mouth 3 (three) times daily.    Marland Kitchen b complex vitamins tablet Take by mouth.     . cyclobenzaprine (FLEXERIL) 10 MG tablet Take 1 tablet (10 mg total) by mouth 3 (three) times daily as needed. 30 tablet 0  . diazepam (VALIUM) 5 MG tablet Take 1 tablet by mouth 3 (three) times daily.    . diclofenac Sodium (VOLTAREN) 1 % GEL APPLY AS DIRECTED TOPICALLY THREE TIMES DAILY    . methocarbamol (ROBAXIN) 500 MG tablet Take 1 tablet (500 mg total) by mouth 2 (two) times daily as needed for muscle spasms. 30 tablet 0  . dexamethasone (DECADRON) 1 MG tablet Take 1 tablet by mouth once at 11 pm, before coming for labs at 8 am the next morning (Patient not taking: Reported on 04/25/2019) 1 tablet 0  . medroxyPROGESTERone (PROVERA) 5 MG tablet Take 1 tablet (5 mg total) by mouth daily for 10 days. 1 tab PO daily x 10 days every 3 months. 10 tablet 4   No facility-administered medications prior to visit.    No Known Allergies  ROS Review of Systems  Constitutional: Positive for fatigue. Negative for unexpected weight change.  HENT: Negative.   Eyes: Negative for photophobia and visual disturbance.  Respiratory: Negative.   Cardiovascular: Negative.   Gastrointestinal: Negative.   Endocrine: Negative for polyphagia and polyuria.  Genitourinary: Negative.   Musculoskeletal: Positive for arthralgias and myalgias.  Skin: Negative for pallor and rash.  Allergic/Immunologic: Negative for immunocompromised state.  Neurological: Positive for speech difficulty, weakness, numbness and headaches.  Psychiatric/Behavioral: Negative.       Objective:    Physical Exam  BP 114/62   Pulse 91   Temp 98.2 F (36.8 C) (Tympanic)   Ht 5\' 8"  (1.727 m)   Wt 236 lb 9.6 oz (107.3 kg)   SpO2 97%   BMI 35.97 kg/m  Wt Readings from Last 3 Encounters:  04/25/19 236 lb 9.6 oz (107.3 kg)  04/19/19 230 lb (104.3 kg)  03/19/19 235 lb (106.6 kg)     There are no preventive care reminders to display for this patient.  There are no preventive care reminders to display for this patient.  Lab  Results  Component Value Date   TSH 1.412 03/19/2019   Lab Results  Component Value Date   WBC 5.8 03/19/2019   HGB 12.6 03/19/2019   HCT 36.8 03/19/2019   MCV 100.0 03/19/2019   PLT 276 03/19/2019   Lab Results  Component Value Date   NA 139 03/19/2019   K 3.9 03/19/2019   CO2 24 03/19/2019   GLUCOSE 95 03/19/2019   BUN 12 03/19/2019   CREATININE 0.74 03/19/2019   BILITOT 0.6 03/19/2019   ALKPHOS 42 03/19/2019   AST 24 03/19/2019   ALT 20 03/19/2019   PROT 6.2 (  L) 03/19/2019   ALBUMIN 3.6 03/19/2019   CALCIUM 8.6 (L) 03/19/2019   ANIONGAP 9 03/19/2019   GFR 94.89 11/03/2018   Lab Results  Component Value Date   CHOL 97 02/09/2013   Lab Results  Component Value Date   HDL 34 (L) 02/09/2013   Lab Results  Component Value Date   LDLCALC 51 02/09/2013   Lab Results  Component Value Date   TRIG 62 02/09/2013   Lab Results  Component Value Date   CHOLHDL 2.9 02/09/2013   Lab Results  Component Value Date   HGBA1C 5.5 02/09/2013      Assessment & Plan:   Problem List Items Addressed This Visit      Other   Arthralgia - Primary   Relevant Orders   B. burgdorfi antibodies   Heavy Metals Profile, Urine   Myalgia   Relevant Orders   B. burgdorfi antibodies   Heavy Metals Profile, Urine   Palpitations   Relevant Orders   B. burgdorfi antibodies   Ceruloplasmin   Heavy Metals Profile, Urine   Ambulatory referral to Cardiology   Paresthesias   Relevant Orders   B. burgdorfi antibodies   Ceruloplasmin   Heavy Metals Profile, Urine      No orders of the defined types were placed in this encounter.   Follow-up: Return in about 3 months (around 07/25/2019).    Mliss Sax, MD

## 2019-04-28 NOTE — Addendum Note (Signed)
Addended by: Varney Biles on: 04/28/2019 03:35 PM   Modules accepted: Orders

## 2019-05-01 LAB — CERULOPLASMIN: Ceruloplasmin: 29 mg/dL (ref 18–53)

## 2019-05-01 LAB — B. BURGDORFI ANTIBODIES: B burgdorferi Ab IgG+IgM: <0.9 index

## 2019-05-01 LAB — HEAVY METALS PROFILE, URINE

## 2019-05-02 NOTE — Progress Notes (Signed)
Cardiology Office Note:    Date:  05/03/2019   ID:  Kayla Wilkins, DOB Apr 30, 1990, MRN [id number]  PCP:  Mliss Sax, MD  Cardiologist:  Norman Herrlich, MD   Referring MD: Mliss Sax,*  ASSESSMENT:    1. Palpitations   2. Malaise   3. High risk medication use    PLAN:    In order of problems listed above:  1. There is a complicated visit with multiple issues however she is having increasing frequency of palpitation the symptoms are frequent bothersome raise concern for underlying heart disease and I told her I think the best approach here see utilize a 7-day monitor to capture the arrhythmia.  Previously she was intolerant of beta-blockers with profound fatigue.  She also takes a high risk medication has exercise intolerance and weakness an echocardiogram is indicated to exclude cardiomyopathy.  The the best approach is to hold off on medications for now there is no indication she has a severe or life-threatening problem gather data and make a decision of follow-up in the office.  She agrees with this approach.  She tells me she saw a neurologist who prescribed Topamax but I told her I do not see any problem with her heart rhythm from taking that medication.  She can continue her Adderall prescription  Next appointment 4 weeks   Medication Adjustments/Labs and Tests Ordered: Current medicines are reviewed at length with the patient today.  Concerns regarding medicines are outlined above.  Orders Placed This Encounter  Procedures  . LONG TERM MONITOR (3-14 DAYS)  . ECHOCARDIOGRAM COMPLETE   No orders of the defined types were placed in this encounter.    Chief Complaint  Patient presents with  . New Patient (Initial Visit)    History of Present Illness:    Kayla Wilkins is a 29 y.o. female who is being seen today for the evaluation of palpitation at the request of Mliss Sax,*.  She has a history of migraine and had 2 ED visits February  28 March 31 with neurologic complaints and migraine.  She had 2 EKGs done both of which were normal.  She has attention deficit disorder and takes Adderall.  She sees me today with complaints of her heart beating or palpitation it takes different forms predominately is a forceful beat and at times can make her breathless or lightheaded.  At times she notices pauses in her heart beating at times her heart fluttering briefly be rapid.  This is happening with increasing frequency and occurs daily.  She also has a longstanding problem with weakness and fatigue and exercise intolerance but no history of heart disease chest pain edema orthopnea or shortness of breath.  She has not had syncope.  She takes no over-the-counter proarrhythmic drugs.  She has had extensive lab test performed and is not anemic and does not have thyroid dysfunction.  In transit by EMS to the hospital on 1 event she was told that she had extra beats. Past Medical History:  Diagnosis Date  . Bilateral ovarian cysts   . Dyspareunia in female 08/21/2015  . Hair loss 08/21/2015  . Irregular periods 08/21/2015  . Irritable 08/21/2015  . Menorrhagia   . Mental disorder   . Urinary urgency 08/21/2015  . Vaginal discharge 08/21/2015    Past Surgical History:  Procedure Laterality Date  . CHOLECYSTECTOMY    . GALLBLADDER SURGERY  2009  . vertical sleeve   01/14/2015    Current Medications: Current Meds  Medication Sig  . amphetamine-dextroamphetamine (ADDERALL) 15 MG tablet Take 15 mg by mouth 3 (three) times daily.  Marland Kitchen b complex vitamins tablet Take by mouth.  . diazepam (VALIUM) 5 MG tablet Take 1 tablet by mouth 3 (three) times daily.  . diclofenac Sodium (VOLTAREN) 1 % GEL APPLY AS DIRECTED TOPICALLY THREE TIMES DAILY  . methocarbamol (ROBAXIN) 500 MG tablet Take 1 tablet (500 mg total) by mouth 2 (two) times daily as needed for muscle spasms.  . Multiple Vitamins-Iron TABS Take by mouth.  . topiramate (TOPAMAX) 25 MG tablet One  daily for a week then one BID for a week then one in AM and Two in PM for a week then 2 BID.     Allergies:   Patient has no known allergies.   Social History   Socioeconomic History  . Marital status: Divorced    Spouse name: Not on file  . Number of children: Not on file  . Years of education: Not on file  . Highest education level: Not on file  Occupational History  . Not on file  Tobacco Use  . Smoking status: Former Smoker    Types: Cigarettes  . Smokeless tobacco: Never Used  Substance and Sexual Activity  . Alcohol use: Yes    Comment: WINE WITH DINNER  . Drug use: No  . Sexual activity: Yes    Partners: Male    Birth control/protection: Condom    Comment: 1st intercourse- 56, partners- 58, divorced,  Other Topics Concern  . Not on file  Social History Narrative  . Not on file   Social Determinants of Health   Financial Resource Strain:   . Difficulty of Paying Living Expenses:   Food Insecurity:   . Worried About Charity fundraiser in the Last Year:   . Arboriculturist in the Last Year:   Transportation Needs:   . Film/video editor (Medical):   Marland Kitchen Lack of Transportation (Non-Medical):   Physical Activity:   . Days of Exercise per Week:   . Minutes of Exercise per Session:   Stress:   . Feeling of Stress :   Social Connections:   . Frequency of Communication with Friends and Family:   . Frequency of Social Gatherings with Friends and Family:   . Attends Religious Services:   . Active Member of Clubs or Organizations:   . Attends Archivist Meetings:   Marland Kitchen Marital Status:      Family History: The patient's family history includes Alzheimer's disease in her maternal grandmother; Anxiety disorder in her mother; Arthritis in her maternal grandmother; Cancer in her maternal grandfather; Colon cancer in her maternal aunt; Depression in her mother; Diabetes in her paternal aunt; Hypertension in her mother; Multiple sclerosis in her maternal aunt;  Schizophrenia in her father and maternal grandfather; Stroke in her maternal grandmother; Varicose Veins in her mother.  ROS:   Review of Systems  Constitution: Positive for malaise/fatigue.  HENT: Negative.   Eyes: Negative.   Cardiovascular: Positive for palpitations.  Respiratory: Negative.   Endocrine: Negative.   Hematologic/Lymphatic: Negative.   Skin: Negative.   Musculoskeletal: Negative.   Genitourinary: Negative.   Neurological: Positive for focal weakness and headaches.  Psychiatric/Behavioral: Negative.   Allergic/Immunologic: Negative.    Please see the history of present illness.    Yes all other systems reviewed and are negative.  EKGs/Labs/Other Studies Reviewed:    The following studies were reviewed today:    Recent  Labs: 03/19/2019: ALT 20; BUN 12; Creatinine, Ser 0.74; Hemoglobin 12.6; Magnesium 1.8; Platelets 276; Potassium 3.9; Sodium 139; TSH 1.412  Recent Lipid Panel    Component Value Date/Time   CHOL 97 02/09/2013 0925   TRIG 62 02/09/2013 0925   HDL 34 (L) 02/09/2013 0925   CHOLHDL 2.9 02/09/2013 0925   VLDL 12 02/09/2013 0925   LDLCALC 51 02/09/2013 0925    Physical Exam:    VS:  BP 120/62   Pulse 98   Ht 5\' 8"  (1.727 m)   Wt 233 lb (105.7 kg)   SpO2 99%   BMI 35.43 kg/m     Wt Readings from Last 3 Encounters:  05/03/19 233 lb (105.7 kg)  04/25/19 236 lb 9.6 oz (107.3 kg)  04/19/19 230 lb (104.3 kg)     GEN:  Well nourished, well developed in no acute distress HEENT: Normal NECK: No JVD; No carotid bruits LYMPHATICS: No lymphadenopathy CARDIAC: RRR, no murmurs, rubs, gallops RESPIRATORY:  Clear to auscultation without rales, wheezing or rhonchi  ABDOMEN: Soft, non-tender, non-distended MUSCULOSKELETAL:  No edema; No deformity  SKIN: Warm and dry NEUROLOGIC:  Alert and oriented x 3 PSYCHIATRIC:  Normal affect     Signed, 04/21/19, MD  05/03/2019 3:31 PM    [facility]

## 2019-05-03 ENCOUNTER — Ambulatory Visit (INDEPENDENT_AMBULATORY_CARE_PROVIDER_SITE_OTHER): Payer: 59

## 2019-05-03 ENCOUNTER — Ambulatory Visit: Payer: 59 | Admitting: Cardiology

## 2019-05-03 ENCOUNTER — Other Ambulatory Visit: Payer: Self-pay

## 2019-05-03 ENCOUNTER — Encounter: Payer: Self-pay | Admitting: Cardiology

## 2019-05-03 VITALS — BP 120/62 | HR 98 | Ht 68.0 in | Wt 233.0 lb

## 2019-05-03 DIAGNOSIS — R002 Palpitations: Secondary | ICD-10-CM

## 2019-05-03 DIAGNOSIS — Z79899 Other long term (current) drug therapy: Secondary | ICD-10-CM | POA: Diagnosis not present

## 2019-05-03 DIAGNOSIS — R5381 Other malaise: Secondary | ICD-10-CM | POA: Diagnosis not present

## 2019-05-03 NOTE — Patient Instructions (Signed)
Medication Instructions:  Your physician recommends that you continue on your current medications as directed. Please refer to the Current Medication list given to you today.  *If you need a refill on your cardiac medications before your next appointment, please call your pharmacy*   Lab Work: None If you have labs (blood work) drawn today and your tests are completely normal, you will receive your results only by: . MyChart Message (if you have MyChart) OR . A paper copy in the mail If you have any lab test that is abnormal or we need to change your treatment, we will call you to review the results.   Testing/Procedures: Your physician has requested that you have an echocardiogram. Echocardiography is a painless test that uses sound waves to create images of your heart. It provides your doctor with information about the size and shape of your heart and how well your heart's chambers and valves are working. This procedure takes approximately one hour. There are no restrictions for this procedure.  A zio monitor was ordered today. It will remain on for 7 days. You will then return monitor and event diary in provided box. It takes 1-2 weeks for report to be downloaded and returned to us. We will call you with the results. If monitor falls off or has orange flashing light, please call Zio for further instructions.      Follow-Up: At [facility], you and your health needs are our priority.  As part of our continuing mission to provide you with exceptional heart care, we have created designated Provider Care Teams.  These Care Teams include your primary Cardiologist (physician) and Advanced Practice Providers (APPs -  Physician Assistants and Nurse Practitioners) who all work together to provide you with the care you need, when you need it.  We recommend signing up for the patient portal called "MyChart".  Sign up information is provided on this After Visit Summary.  MyChart is used to connect  with patients for Virtual Visits (Telemedicine).  Patients are able to view lab/test results, encounter notes, upcoming appointments, etc.  Non-urgent messages can be sent to your provider as well.   To learn more about what you can do with MyChart, go to [email or web address].com.    Your next appointment:   6 week(s)  The format for your next appointment:   In Person  Provider:   Brian Munley, MD   Other Instructions   

## 2019-05-04 ENCOUNTER — Encounter: Payer: Self-pay | Admitting: Family Medicine

## 2019-05-06 DIAGNOSIS — F450 Somatization disorder: Secondary | ICD-10-CM | POA: Insufficient documentation

## 2019-05-10 ENCOUNTER — Ambulatory Visit (HOSPITAL_BASED_OUTPATIENT_CLINIC_OR_DEPARTMENT_OTHER)
Admission: RE | Admit: 2019-05-10 | Discharge: 2019-05-10 | Disposition: A | Discharge status: Home / Self Care | Payer: 59 | Source: Ambulatory Visit | Attending: Cardiology | Admitting: Cardiology

## 2019-05-10 ENCOUNTER — Other Ambulatory Visit: Payer: Self-pay

## 2019-05-10 DIAGNOSIS — R002 Palpitations: Secondary | ICD-10-CM | POA: Diagnosis not present

## 2019-05-12 ENCOUNTER — Telehealth: Payer: Self-pay

## 2019-05-12 NOTE — Telephone Encounter (Signed)
Spoke to patient in regards to her echo results. Patient verbalizes understanding and does not voice any other issues or concerns.    Encouraged patient to call back with any questions or concerns.

## 2019-05-13 ENCOUNTER — Encounter: Payer: Self-pay | Admitting: Cardiology

## 2019-06-05 ENCOUNTER — Telehealth: Payer: Self-pay

## 2019-06-05 NOTE — Telephone Encounter (Signed)
-----   Message from Baldo Daub, MD sent at 06/05/2019  1:02 PM EDT ----- Normal or stable result  The monitor result is good she does not have a high frequency of extra beats but she is aware of them at times and most of the episodes where she triggered and diary overall heart rhythm.  Please be sure she keeps her appointment for office follow-up.  At this time I be hesitant to give her any medications like a beta-blocker as it can undo the effects of Adderall.

## 2019-06-05 NOTE — Telephone Encounter (Signed)
Tried calling patient. No answer and voicemail is full so I can not leave a message at this time.  

## 2019-06-06 ENCOUNTER — Telehealth: Payer: Self-pay

## 2019-06-06 NOTE — Telephone Encounter (Signed)
Spoke with patient regarding results and recommendation.  Patient verbalizes understanding and is agreeable to plan of care. Advised patient to call back with any issues or concerns.  

## 2019-06-27 ENCOUNTER — Ambulatory Visit: Payer: 59 | Admitting: Cardiology

## 2019-07-03 ENCOUNTER — Other Ambulatory Visit: Payer: Self-pay

## 2019-07-03 DIAGNOSIS — D649 Anemia, unspecified: Secondary | ICD-10-CM | POA: Insufficient documentation

## 2019-07-03 DIAGNOSIS — G4700 Insomnia, unspecified: Secondary | ICD-10-CM | POA: Insufficient documentation

## 2019-07-09 NOTE — Progress Notes (Deleted)
Cardiology Office Note:    Date:  07/09/2019   ID:  Kayla Wilkins, DOB May 07, 1990, MRN [id number]  PCP:  Mliss Sax, MD  Cardiologist:  Norman Herrlich, MD    Referring MD: Mliss Sax,*    ASSESSMENT:    No diagnosis found. PLAN:    In order of problems listed above:  1. ***   Next appointment: ***   Medication Adjustments/Labs and Tests Ordered: Current medicines are reviewed at length with the patient today.  Concerns regarding medicines are outlined above.  No orders of the defined types were placed in this encounter.  No orders of the defined types were placed in this encounter.   No chief complaint on file.   History of Present Illness:    Kayla Wilkins is a 29 y.o. female with a hx of palpitation last seen 04/14/2021She has a history of migraine and had 2 ED visits February 28 March 31 with neurologic complaints and migraine.  She had 2 EKGs done both of which were normal.  She has attention deficit disorder and takes Adderall.  . Compliance with diet, lifestyle and medications: ***  Echo 05/10/2019: 1. Left ventricular ejection fraction, by estimation, is 60 to 65%. The  left ventricle has normal function. The left ventricle has no regional  wall motion abnormalities. Left ventricular diastolic parameters were  normal.  2. Right ventricular systolic function is normal. The right ventricular  size is normal. There is normal pulmonary artery systolic pressure.  3. The mitral valve is normal in structure. Mild mitral valve  regurgitation. No evidence of mitral stenosis.  4. The aortic valve is normal in structure. Aortic valve regurgitation is  not visualized. No aortic stenosis is present.  5. The inferior vena cava is normal in size with greater than 50%  respiratory variability, suggesting right atrial pressure of 3 mmHg.  Zio monitor 8 days: Conclusion, rare ventricular and supraventricular ectopy, the triggered an event  diary events were at times associated with atrial and ventricular premature contractions however usually with sinus rhythm and sinus tachycardia..   Past Medical History:  Diagnosis Date  . Bilateral ovarian cysts   . Dyspareunia in female 08/21/2015  . Hair loss 08/21/2015  . Irregular periods 08/21/2015  . Irritable 08/21/2015  . Menorrhagia   . Mental disorder   . Urinary urgency 08/21/2015  . Vaginal discharge 08/21/2015    Past Surgical History:  Procedure Laterality Date  . CHOLECYSTECTOMY    . GALLBLADDER SURGERY  2009  . vertical sleeve   01/14/2015    Current Medications: No outpatient medications have been marked as taking for the 07/10/19 encounter (Appointment) with Baldo Daub, MD.     Allergies:   Patient has no known allergies.   Social History   Socioeconomic History  . Marital status: Divorced    Spouse name: Not on file  . Number of children: Not on file  . Years of education: Not on file  . Highest education level: Not on file  Occupational History  . Not on file  Tobacco Use  . Smoking status: Former Smoker    Types: Cigarettes  . Smokeless tobacco: Never Used  Vaping Use  . Vaping Use: Never used  Substance and Sexual Activity  . Alcohol use: Yes    Comment: WINE WITH DINNER  . Drug use: No  . Sexual activity: Yes    Partners: Male    Birth control/protection: Condom    Comment: 1st intercourse- 32, partners- 9,  divorced,  Other Topics Concern  . Not on file  Social History Narrative  . Not on file   Social Determinants of Health   Financial Resource Strain:   . Difficulty of Paying Living Expenses:   Food Insecurity:   . Worried About Charity fundraiser in the Last Year:   . Arboriculturist in the Last Year:   Transportation Needs:   . Film/video editor (Medical):   Marland Kitchen Lack of Transportation (Non-Medical):   Physical Activity:   . Days of Exercise per Week:   . Minutes of Exercise per Session:   Stress:   . Feeling of Stress :     Social Connections:   . Frequency of Communication with Friends and Family:   . Frequency of Social Gatherings with Friends and Family:   . Attends Religious Services:   . Active Member of Clubs or Organizations:   . Attends Archivist Meetings:   Marland Kitchen Marital Status:      Family History: The patient's ***family history includes Alzheimer's disease in her maternal grandmother; Anxiety disorder in her mother; Arthritis in her maternal grandmother; Cancer in her maternal grandfather; Colon cancer in her maternal aunt; Depression in her mother; Diabetes in her paternal aunt; Hypertension in her mother; Multiple sclerosis in her maternal aunt; Schizophrenia in her father and maternal grandfather; Stroke in her maternal grandmother; Varicose Veins in her mother. ROS:   Please see the history of present illness.    All other systems reviewed and are negative.  EKGs/Labs/Other Studies Reviewed:    The following studies were reviewed today:  EKG:  EKG ordered today and personally reviewed.  The ekg ordered today demonstrates ***  Recent Labs: 03/19/2019: ALT 20; BUN 12; Creatinine, Ser 0.74; Hemoglobin 12.6; Magnesium 1.8; Platelets 276; Potassium 3.9; Sodium 139; TSH 1.412  Recent Lipid Panel    Component Value Date/Time   CHOL 97 02/09/2013 0925   TRIG 62 02/09/2013 0925   HDL 34 (L) 02/09/2013 0925   CHOLHDL 2.9 02/09/2013 0925   VLDL 12 02/09/2013 0925   LDLCALC 51 02/09/2013 0925    Physical Exam:    VS:  There were no vitals taken for this visit.    Wt Readings from Last 3 Encounters:  05/03/19 233 lb (105.7 kg)  04/25/19 236 lb 9.6 oz (107.3 kg)  04/19/19 230 lb (104.3 kg)     GEN: *** Well nourished, well developed in no acute distress HEENT: Normal NECK: No JVD; No carotid bruits LYMPHATICS: No lymphadenopathy CARDIAC: ***RRR, no murmurs, rubs, gallops RESPIRATORY:  Clear to auscultation without rales, wheezing or rhonchi  ABDOMEN: Soft, non-tender,  non-distended MUSCULOSKELETAL:  No edema; No deformity  SKIN: Warm and dry NEUROLOGIC:  Alert and oriented x 3 PSYCHIATRIC:  Normal affect    Signed, Shirlee More, MD  07/09/2019 4:40 PM    [facility]

## 2019-07-10 ENCOUNTER — Ambulatory Visit: Payer: 59 | Admitting: Cardiology

## 2019-07-27 ENCOUNTER — Ambulatory Visit: Payer: 59 | Admitting: Family Medicine

## 2019-07-27 DIAGNOSIS — Z0289 Encounter for other administrative examinations: Secondary | ICD-10-CM

## 2019-08-18 ENCOUNTER — Encounter: Payer: Self-pay | Admitting: Family Medicine

## 2019-08-18 ENCOUNTER — Telehealth: Payer: Self-pay | Admitting: Family Medicine

## 2019-08-18 NOTE — Telephone Encounter (Signed)
Pt was no show for appt 07/27/19. Second occurrence. Letter mailed.  PCP,  Please reply back with corresponding letter matching appropriate follow up needs.  A - No follow up necessary B - Follow up urgent - locate patient immediately to schedule appointment. C - Follow up necessary. Contact patient and schedule visit w/in 7 days. D - Follow up necessary. Contact patient and schedule visit w/in 2-4 weeks.  E - Follow up necessary. Contact patient and schedule visit w/in 3 months.

## 2020-01-20 DIAGNOSIS — Z8616 Personal history of COVID-19: Secondary | ICD-10-CM

## 2020-01-20 HISTORY — DX: Personal history of COVID-19: Z86.16

## 2020-04-19 ENCOUNTER — Encounter: Payer: Self-pay | Admitting: Obstetrics & Gynecology

## 2020-04-19 ENCOUNTER — Ambulatory Visit: Payer: 59 | Admitting: Obstetrics & Gynecology

## 2020-04-19 ENCOUNTER — Other Ambulatory Visit: Payer: Self-pay

## 2020-04-19 VITALS — BP 116/78

## 2020-04-19 DIAGNOSIS — N898 Other specified noninflammatory disorders of vagina: Secondary | ICD-10-CM

## 2020-04-19 DIAGNOSIS — N920 Excessive and frequent menstruation with regular cycle: Secondary | ICD-10-CM | POA: Diagnosis not present

## 2020-04-19 LAB — WET PREP FOR TRICH, YEAST, CLUE

## 2020-04-19 MED ORDER — TINIDAZOLE 500 MG PO TABS: 1000.0000 mg | ORAL_TABLET | Freq: Two times a day (BID) | ORAL | 0 refills | Status: AC

## 2020-04-19 NOTE — Progress Notes (Signed)
    Kayla Wilkins 07-11-1990 [id number]        30 y.o.  G0  RP: Vaginal discharge and longer heavier menstrual period x1  HPI: Had more regular menstrual periods in the last 6-7 months, coming every 30-36 days with normal flow and lasting about 4 days.  The LMP was very heavy and lasted 16 days.  Complains of an increase in vaginal discharge associated with the long duration of vaginal bleeding.  No pelvic pain currently.  Urine and bowel movements normal.  No fever.   OB History  Gravida Para Term Preterm AB Living  0 0 0 0 0 0  SAB IAB Ectopic Multiple Live Births  0 0 0 0 0    Past medical history,surgical history, problem list, medications, allergies, family history and social history were all reviewed and documented in the EPIC chart.   Directed ROS with pertinent positives and negatives documented in the history of present illness/assessment and plan.  Exam:  Vitals:   04/19/20 1533  BP: 116/78   General appearance:  Normal  Abdomen: Normal  Gynecologic exam: Vulva normal.  Speculum: Cervix and vagina normal.  Increased vaginal discharge.  Wet prep done.  Bimanual exam: Uterus anteverted, normal volume, nontender, mobile.  No adnexal mass, nontender bilaterally.  Wet prep: Clue cells present with odor.   Assessment/Plan:  30 y.o. G0P0000   1. Vaginal discharge Bacterial vaginosis confirmed by wet prep.  Prevention and treatment reviewed.  Decision to treat with tinidazole 2 tablets per mouth twice a day for 2 days.  Usage reviewed and prescription sent to pharmacy. - WET PREP FOR TRICH, YEAST, CLUE  2. Menorrhagia with regular cycle Normal gynecologic exam.  Will observe at this time.  If menorrhagia persists with next menstrual period, will call to schedule a pelvic US and will control with Megace until the [place].  Novasure Endometrial Ablation briefly discussed.  Other orders - tinidazole (TINDAMAX) 500 MG tablet; Take 2 tablets (1,000 mg total) by mouth 2 (two)  times daily for 2 days.  [name] MD, 3:55 PM 04/19/2020

## 2020-04-21 ENCOUNTER — Encounter: Payer: Self-pay | Admitting: Obstetrics & Gynecology

## 2020-05-24 ENCOUNTER — Telehealth: Payer: Self-pay | Admitting: *Deleted

## 2020-05-24 DIAGNOSIS — N920 Excessive and frequent menstruation with regular cycle: Secondary | ICD-10-CM

## 2020-05-24 NOTE — Telephone Encounter (Signed)
Patient called to follow up from OV on 04/19/20 " If menorrhagia persists with next menstrual period, will call to schedule a pelvic US and will control with Megace until the [place]"  Patient said she had a 3 1/2 week cycle that ended last week and now a new cycle start today. She asked if megace can be sent pharmacy and she will schedule ultrasound and take megace until ultrasound appointment. She is NOT bleeding heavy ( changing pad less than 30 minutes or 1 hour) normal cycle flow.   Please advise

## 2020-05-27 NOTE — Telephone Encounter (Signed)
Dr.Lavoie replied to the below "Please send Megace 40 mg BID until Pelvic US. Schedule Pelvic US."

## 2020-06-02 ENCOUNTER — Other Ambulatory Visit: Payer: Self-pay | Admitting: Obstetrics and Gynecology

## 2020-06-02 DIAGNOSIS — N939 Abnormal uterine and vaginal bleeding, unspecified: Secondary | ICD-10-CM

## 2020-06-02 MED ORDER — MEGESTROL ACETATE 40 MG PO TABS: 40.0000 mg | ORAL_TABLET | Freq: Two times a day (BID) | ORAL | 1 refills | Status: DC

## 2020-06-02 NOTE — Progress Notes (Signed)
The patient called this morning c/o heavy bleeding with passage of clots. She has been bleeding for most of the last few months, mostly light. This morning she has passed 6 quarter sized clots and satuarted a pad in an hour. She has an appointment next month with Dr Seymour Bars for an ultrasound.There is a mychart message that the patient never saw from 05/22/20 where Dr Seymour Bars recommended she start on Megace 40 mg BID until her ultrasound. She didn't know, so she didn't pick up the medication. Megace called into her pharmacy, she will start it now. Will also take ibuprofen. She will call back if her bleeding doesn't slow down.

## 2020-06-03 NOTE — Telephone Encounter (Signed)
Dr.Jertson sent Rx on 06/02/20, patient is taking megace daily as directed, aware NOT to stop medication.

## 2020-06-03 NOTE — Telephone Encounter (Signed)
Patient scheduled for ultrasound 06/26/20, I called to relay the megace Rx, however her voicemail is still full.

## 2020-06-26 ENCOUNTER — Ambulatory Visit: Payer: 59 | Admitting: Obstetrics & Gynecology

## 2020-06-26 ENCOUNTER — Encounter: Payer: Self-pay | Admitting: Obstetrics & Gynecology

## 2020-06-26 ENCOUNTER — Ambulatory Visit (INDEPENDENT_AMBULATORY_CARE_PROVIDER_SITE_OTHER): Payer: 59

## 2020-06-26 ENCOUNTER — Other Ambulatory Visit: Payer: Self-pay

## 2020-06-26 VITALS — BP 128/86

## 2020-06-26 DIAGNOSIS — N920 Excessive and frequent menstruation with regular cycle: Secondary | ICD-10-CM

## 2020-06-26 DIAGNOSIS — N921 Excessive and frequent menstruation with irregular cycle: Secondary | ICD-10-CM

## 2020-06-26 DIAGNOSIS — N840 Polyp of corpus uteri: Secondary | ICD-10-CM

## 2020-06-26 LAB — CBC
HCT: 41.9 % (ref 35.0–45.0)
Hemoglobin: 14.3 g/dL (ref 11.7–15.5)
MCH: 32.5 pg (ref 27.0–33.0)
MCHC: 34.1 g/dL (ref 32.0–36.0)
MCV: 95.2 fL (ref 80.0–100.0)
MPV: 10.5 fL (ref 7.5–12.5)
Platelets: 475 10*3/uL — ABNORMAL HIGH (ref 140–400)
RBC: 4.4 10*6/uL (ref 3.80–5.10)
RDW: 12.2 % (ref 11.0–15.0)
WBC: 7.9 10*3/uL (ref 3.8–10.8)

## 2020-06-26 LAB — FOLLICLE STIMULATING HORMONE: FSH: 4.5 m[IU]/mL

## 2020-06-26 LAB — TSH: TSH: 1.99 mIU/L

## 2020-06-26 MED ORDER — NORETHINDRONE 0.35 MG PO TABS: 1.0000 | ORAL_TABLET | Freq: Every day | ORAL | 4 refills | Status: DC

## 2020-06-26 NOTE — Progress Notes (Signed)
    Kayla Wilkins [birth year] [id number]        30 y.o.  G0  RP: Menorrhagia for Pelvic US  HPI: Had more regular menstrual periods in the last 6-7 months, coming every 30-36 days with normal flow.  But the last 2 menstrual periods were very heavy and lasted more than 2 weeks each time.  Complains of an increase in vaginal discharge associated with the long duration of vaginal bleeding.  No pelvic pain currently.  Urine and bowel movements normal.  No fever.   OB History  Gravida Para Term Preterm AB Living  0 0 0 0 0 0  SAB IAB Ectopic Multiple Live Births  0 0 0 0 0    Past medical history,surgical history, problem list, medications, allergies, family history and social history were all reviewed and documented in the EPIC chart.   Directed ROS with pertinent positives and negatives documented in the history of present illness/assessment and plan.  Exam:  Vitals:   06/26/20 1443  BP: 128/86   General appearance:  Normal  Pelvic US today: T/V images.  Anteverted uterus measured at 7.49 x 5.81 x 4.11 cm.  1.8 cm subserosal fibroid is noted.  The endometrial lining is measured at 7.22 mm.  Hypervascular area measured at 1.3 x 0.9 cm is seen which is compatible with an endometrial polyp.  Both ovaries are normal in size with normal follicular pattern.  No adnexal mass seen.  No free fluid in the posterior cul-de-sac.   Assessment/Plan:  30 y.o. G0  1. Menometrorrhagia Menometrorrhagia.  Will rule out secondary anemia with a CBC today.  We will also rule out thyroid dysfunction and draw an [facility] for ovarian function evaluation given previous oligo menorrhagia.  Pelvic ultrasound findings thoroughly reviewed with patient.  The endometrial lining presents hypervascular area measured at 1.3 x 0.9 cm compatible with an endometrial polyp.  Decision to proceed with a hysteroscopy with MyoSure excision and dilation and curettage.  Hysteroscopy pamphlet and information given.  Follow-up preop.   Decision to start on the progestin only birth control pill in an attempt to control the heavy bleeding until surgery.  Usage reviewed and prescription sent to pharmacy. - CBC - TSH - FSH  2. Endometrial polyp Schedule HSC/Myosure Excision/D+C.  F/U Preop.    Other orders - norethindrone (MICRONOR) 0.35 MG tablet; Take 1 tablet (0.35 mg total) by mouth daily.  [name] MD, 2:57 PM 06/26/2020

## 2020-07-01 ENCOUNTER — Encounter: Payer: Self-pay | Admitting: Obstetrics & Gynecology

## 2020-07-03 ENCOUNTER — Telehealth: Payer: Self-pay | Admitting: *Deleted

## 2020-07-03 NOTE — Telephone Encounter (Signed)
Left message to call Devaney Segers, RN at [facility], [phone].  

## 2020-07-03 NOTE — Telephone Encounter (Signed)
-----   Message from [name], MD sent at 06/26/2020  3:19 PM EDT ----- Regarding: Schedule surgery Surgery: Hysteroscopy, Myosure excision, Dilation and Curettage  Diagnosis:  Menometrorrhagia/Endometrial Polyp  Location: [facility]  Status: Outpatient  Time: 30 Minutes  Assistant: N/A  Urgency: First Available  Pre-Op Appointment: To Be Scheduled  Post-Op Appointment(s): 2 Weeks  Time Out Of Work: Day Of Surgery, 1 Day Post Op

## 2020-07-04 NOTE — Telephone Encounter (Signed)
Left message to call Keelynn Furgerson, RN at [facility], [phone].  

## 2020-07-05 NOTE — Telephone Encounter (Signed)
Kayla Wilkins to Kayla Del, MD       7:14 AM Good morning,   I saw you last week for an ultrasound and we discussed setting an appt for hysteroscopy, but no one has called me yet to schedule one. I just wanted to make sure this was normal and follow up.   Thank you, Kayla Wilkins

## 2020-07-05 NOTE — Telephone Encounter (Signed)
See open telephone encounter dated 07/03/20.   Encounter closed.

## 2020-07-10 NOTE — Telephone Encounter (Signed)
Spoke with patient. Reviewed surgery dates. Patient request to proceed with surgery on 09/04/20. Patient declines earlier dates due to her work schedule. Advised patient I will f/u once date and time is confirmed. Patient agreeable.   Surgery request sent.   Routing to [facility]

## 2020-07-10 NOTE — Telephone Encounter (Signed)
Returned call to patient, Left message to call Noreene Larsson, RN at [facility], [phone].

## 2020-07-16 ENCOUNTER — Ambulatory Visit: Payer: 59 | Admitting: Obstetrics & Gynecology

## 2020-07-16 NOTE — Telephone Encounter (Signed)
Call to patient. Per DPR, OK to leave message on voicemail.   Left voicemail requesting a return call to review benefits for Scheduled Surgery with Marie-Lyne Lavoie, MD, FACOG, FRCSC, MA.  

## 2020-07-23 ENCOUNTER — Ambulatory Visit: Payer: 59 | Admitting: Obstetrics & Gynecology

## 2020-07-23 DIAGNOSIS — Z0289 Encounter for other administrative examinations: Secondary | ICD-10-CM

## 2020-07-31 ENCOUNTER — Telehealth: Payer: Self-pay | Admitting: *Deleted

## 2020-07-31 NOTE — Telephone Encounter (Signed)
Prior FMLA dates are 09/04/2020-8/18 May we approve new FMLA  dates for patient 09/03/2020-09/06/2020 I will route this to DR. Lavoie for appoval       My chart message from pt below:   I was hoping these documents would include aug 16 also, as I would have to work that night (8-16) from 630 PM until 630 AM (8-17), and leave possibly straight from work to go in for the surgery (if the 16th is not included in the FMLA time. Also, if possible, if it could be extended through the 19th that would help as well. 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 If im at all impaired or feeling poorly, theres a chance that my actions/choices may end up risking someones life - either a 911 caller's or an LEO). Aside from that, my job doesn't offer flexibility for Hygiene/health needs (ex: we have to put our names on a restroom request list that can sometimes take an hour+ to cycle through, bc someone has to cover our first responder radio dispatch positions while we're up for restroom breaks). I understand if those dates can't be covered, but I thought I would at least explain my reasoning for requesting the 16th-19th just in case. Thank you for your help with this!   Sincerely, Kayla Wilkins

## 2020-08-02 NOTE — Telephone Encounter (Signed)
Left message to call Dominik Yordy, RN at [facility], [phone].  

## 2020-08-05 NOTE — Telephone Encounter (Signed)
Spoke with patient. Surgery date request confirmed.  Advised surgery is scheduled for [facility] on 09/04/20 at 0830. Surgery instruction sheet and hospital brochure reviewed, printed copy will be mailed.  Patient advised if Covid screening and quarantine requirements and agreeable.   Routing to provider. Encounter closed.  Cc: [facility]

## 2020-08-22 ENCOUNTER — Ambulatory Visit (INDEPENDENT_AMBULATORY_CARE_PROVIDER_SITE_OTHER): Payer: 59 | Admitting: Obstetrics & Gynecology

## 2020-08-22 ENCOUNTER — Other Ambulatory Visit: Payer: Self-pay

## 2020-08-22 ENCOUNTER — Encounter: Payer: Self-pay | Admitting: Obstetrics & Gynecology

## 2020-08-22 VITALS — BP 122/80 | HR 86 | Resp 20 | Ht 69.0 in | Wt 270.4 lb

## 2020-08-22 DIAGNOSIS — N840 Polyp of corpus uteri: Secondary | ICD-10-CM | POA: Diagnosis not present

## 2020-08-22 DIAGNOSIS — N921 Excessive and frequent menstruation with irregular cycle: Secondary | ICD-10-CM

## 2020-08-22 NOTE — Progress Notes (Signed)
    Kayla Wilkins 05/04/1990 [id number]        30 y.o.  G0  RP: Preop HSC/Myosure Excision/D+C on 09/04/2020  HPI: Well on Megace.  No heavy vaginal bleeding.  No pelvic pain.   OB History  Gravida Para Term Preterm AB Living  0 0 0 0 0 0  SAB IAB Ectopic Multiple Live Births  0 0 0 0 0    Past medical history,surgical history, problem list, medications, allergies, family history and social history were all reviewed and documented in the EPIC chart.   Directed ROS with pertinent positives and negatives documented in the history of present illness/assessment and plan.  Exam:  Vitals:   08/22/20 1534  BP: 122/80  Pulse: 86  Resp: 20  SpO2: 98%  Weight: 270 lb 6.4 oz (122.7 kg)  Height: 5\' 9"  (1.753 m)   General appearance:  Normal  Pelvic 06/26/2020: T/V images.  Anteverted uterus measured at 7.49 x 5.81 x 4.11 cm.  1.8 cm subserosal fibroid is noted.  The endometrial lining is measured at 7.22 mm.  Hypervascular area measured at 1.3 x 0.9 cm is seen which is compatible with an endometrial polyp.  Both ovaries are normal in size with normal follicular pattern.  No adnexal mass seen.  No free fluid in the posterior cul-de-sac.   Assessment/Plan:  30 y.o. G0  1. Menometrorrhagia Menometrorrhagia with decreased flow on Megace.  Pelvic ultrasound showing a thickened endometrial lining with a probable 1.3 cm polyp.  Decision to proceed with hysteroscopy, MyoSure excision and D&C.  Surgical procedure, risks and benefits thoroughly reviewed with patient.  Preop preparation and postop precautions discussed.  Patient voiced understanding and agreement with plan.  2. Endometrial polyp As above.  Other orders - amphetamine-dextroamphetamine (ADDERALL) 15 MG tablet; Take 15 mg by mouth in the morning and at bedtime. - desvenlafaxine (PRISTIQ) 50 MG 24 hr tablet; Take 50 mg by mouth daily. - megestrol (MEGACE) 40 MG tablet; Take 40 mg by mouth daily. Starts first day of menses                          Patient was counseled as to the risk of surgery to include the following:  1. Infection (prohylactic antibiotics will be administered)  2. DVT/Pulmonary Embolism (prophylactic pneumo compression stockings will be used)  3.Trauma to internal organs requiring additional surgical procedure to repair any injury to internal organs requiring perhaps additional hospitalization days.  4.Hemmorhage requiring transfusion and blood products which carry risks such as anaphylactic reaction, hepatitis and AIDS  Patient had received literature information on the procedure scheduled and all her questions were answered and fully accepts all risk.    34 MD, 3:58 PM 08/22/2020

## 2020-08-23 ENCOUNTER — Encounter: Payer: Self-pay | Admitting: Obstetrics & Gynecology

## 2020-08-29 ENCOUNTER — Encounter (HOSPITAL_BASED_OUTPATIENT_CLINIC_OR_DEPARTMENT_OTHER): Payer: Self-pay | Admitting: Obstetrics & Gynecology

## 2020-08-29 ENCOUNTER — Other Ambulatory Visit: Payer: Self-pay

## 2020-08-29 NOTE — Progress Notes (Signed)
Spoke w/ via phone for pre-op interview--- Pt Lab needs dos----  CBC, Urine preg             Lab results------ no COVID test -----patient states asymptomatic no test needed Arrive at ------- 0630 on 09-04-2020 NPO after MN NO Solid Food.  Clear liquids from MN until--- 0530 Med rec completed Medications to take morning of surgery ----- Micronor Diabetic medication ----- n/a Patient instructed no nail polish to be worn day of surgery Patient instructed to bring photo id and insurance card day of surgery Patient aware to have Driver (ride ) / caregiver for 24 hours after surgery --friend, Alla Feeling Patient Special Instructions ----- n/a Pre-Op special Istructions ----- n/a Patient verbalized understanding of instructions that were given at this phone interview. Patient denies shortness of breath, chest pain, fever, cough at this phone interview.

## 2020-08-30 DIAGNOSIS — Z0289 Encounter for other administrative examinations: Secondary | ICD-10-CM

## 2020-09-03 NOTE — Anesthesia Preprocedure Evaluation (Addendum)
Anesthesia Evaluation  Patient identified by MRN, date of birth, ID band Patient awake    Reviewed: Allergy & Precautions, NPO status , Patient's Chart, lab work & pertinent test results  History of Anesthesia Complications Negative for: history of anesthetic complications  Airway Mallampati: I  TM Distance: >3 FB Neck ROM: Full    Dental  (+) Dental Advisory Given, Teeth Intact   Pulmonary Current Smoker (vapes)Patient did not abstain from smoking.,    breath sounds clear to auscultation       Cardiovascular negative cardio ROS   Rhythm:Regular Rate:Normal  '21 ECHO: EF 60-65%, normal LVF, mild MR   Neuro/Psych  Headaches, PSYCHIATRIC DISORDERS (ADD) Anxiety Depression Chiari 1 malformation    GI/Hepatic Neg liver ROS, S/p gastric sleeve   Endo/Other  Morbid obesity  Renal/GU negative Renal ROS     Musculoskeletal  (+) Arthritis ,   Abdominal (+) + obese,   Peds  Hematology negative hematology ROS (+)   Anesthesia Other Findings   Reproductive/Obstetrics                            Anesthesia Physical Anesthesia Plan  ASA: 3  Anesthesia Plan: General   Post-op Pain Management:    Induction: Intravenous  PONV Risk Score and Plan: 3 and Ondansetron, Propofol infusion and Scopolamine patch - Pre-op  Airway Management Planned: LMA  Additional Equipment: None  Intra-op Plan:   Post-operative Plan:   Informed Consent: I have reviewed the patients History and Physical, chart, labs and discussed the procedure including the risks, benefits and alternatives for the proposed anesthesia with the patient or authorized representative who has indicated his/her understanding and acceptance.     Dental advisory given  Plan Discussed with: CRNA and Surgeon  Anesthesia Plan Comments:        Anesthesia Quick Evaluation

## 2020-09-04 ENCOUNTER — Encounter (HOSPITAL_BASED_OUTPATIENT_CLINIC_OR_DEPARTMENT_OTHER)
Admission: RE | Disposition: A | Discharge status: Home / Self Care | Payer: Self-pay | Source: Home / Self Care | Attending: Obstetrics & Gynecology

## 2020-09-04 ENCOUNTER — Ambulatory Visit (HOSPITAL_BASED_OUTPATIENT_CLINIC_OR_DEPARTMENT_OTHER): Payer: 59 | Admitting: Anesthesiology

## 2020-09-04 ENCOUNTER — Encounter (HOSPITAL_BASED_OUTPATIENT_CLINIC_OR_DEPARTMENT_OTHER): Payer: Self-pay | Admitting: Obstetrics & Gynecology

## 2020-09-04 ENCOUNTER — Other Ambulatory Visit: Payer: Self-pay

## 2020-09-04 ENCOUNTER — Ambulatory Visit (HOSPITAL_BASED_OUTPATIENT_CLINIC_OR_DEPARTMENT_OTHER)
Admission: RE | Admit: 2020-09-04 | Discharge: 2020-09-04 | Disposition: A | Discharge status: Home / Self Care | Payer: 59 | Attending: Obstetrics & Gynecology | Admitting: Obstetrics & Gynecology

## 2020-09-04 DIAGNOSIS — Z9049 Acquired absence of other specified parts of digestive tract: Secondary | ICD-10-CM | POA: Insufficient documentation

## 2020-09-04 DIAGNOSIS — Z9884 Bariatric surgery status: Secondary | ICD-10-CM | POA: Diagnosis not present

## 2020-09-04 DIAGNOSIS — N921 Excessive and frequent menstruation with irregular cycle: Secondary | ICD-10-CM | POA: Insufficient documentation

## 2020-09-04 DIAGNOSIS — Z8616 Personal history of COVID-19: Secondary | ICD-10-CM | POA: Insufficient documentation

## 2020-09-04 DIAGNOSIS — Z79899 Other long term (current) drug therapy: Secondary | ICD-10-CM | POA: Diagnosis not present

## 2020-09-04 DIAGNOSIS — Z87891 Personal history of nicotine dependence: Secondary | ICD-10-CM | POA: Insufficient documentation

## 2020-09-04 DIAGNOSIS — N840 Polyp of corpus uteri: Secondary | ICD-10-CM | POA: Diagnosis not present

## 2020-09-04 HISTORY — DX: Iron deficiency anemia, unspecified: D50.9

## 2020-09-04 HISTORY — DX: Personal history of other specified conditions: Z87.898

## 2020-09-04 HISTORY — DX: Anxiety disorder, unspecified: F41.9

## 2020-09-04 HISTORY — DX: Headache, unspecified: R51.9

## 2020-09-04 HISTORY — DX: Other specified behavioral and emotional disorders with onset usually occurring in childhood and adolescence: F98.8

## 2020-09-04 HISTORY — DX: Polyp of corpus uteri: N84.0

## 2020-09-04 HISTORY — DX: Excessive and frequent menstruation with irregular cycle: N92.1

## 2020-09-04 HISTORY — DX: Depression, unspecified: F32.A

## 2020-09-04 HISTORY — PX: DILATATION & CURETTAGE/HYSTEROSCOPY WITH MYOSURE: SHX6511

## 2020-09-04 LAB — CBC
HCT: 42.8 % (ref 36.0–46.0)
Hemoglobin: 14.8 g/dL (ref 12.0–15.0)
MCH: 33.6 pg (ref 26.0–34.0)
MCHC: 34.6 g/dL (ref 30.0–36.0)
MCV: 97.3 fL (ref 80.0–100.0)
Platelets: 410 10*3/uL — ABNORMAL HIGH (ref 150–400)
RBC: 4.4 MIL/uL (ref 3.87–5.11)
RDW: 13.1 % (ref 11.5–15.5)
WBC: 7.9 10*3/uL (ref 4.0–10.5)
nRBC: 0 % (ref 0.0–0.2)

## 2020-09-04 LAB — POCT PREGNANCY, URINE: Preg Test, Ur: NEGATIVE

## 2020-09-04 SURGERY — DILATATION & CURETTAGE/HYSTEROSCOPY WITH MYOSURE
Anesthesia: General

## 2020-09-04 MED ORDER — PROPOFOL 10 MG/ML IV BOLUS
INTRAVENOUS | Status: DC | PRN
  Administered 2020-09-04: 200 mg via INTRAVENOUS

## 2020-09-04 MED ORDER — DEXAMETHASONE SODIUM PHOSPHATE 4 MG/ML IJ SOLN
INTRAMUSCULAR | Status: DC | PRN
  Administered 2020-09-04: 8 mg via INTRAVENOUS

## 2020-09-04 MED ORDER — GLYCOPYRROLATE PF 0.2 MG/ML IJ SOSY
PREFILLED_SYRINGE | INTRAMUSCULAR | Status: AC
  Filled 2020-09-04: qty 1

## 2020-09-04 MED ORDER — FENTANYL CITRATE (PF) 100 MCG/2ML IJ SOLN
INTRAMUSCULAR | Status: AC
  Filled 2020-09-04: qty 2

## 2020-09-04 MED ORDER — LIDOCAINE HCL 1 % IJ SOLN
INTRAMUSCULAR | Status: DC | PRN
  Administered 2020-09-04: 10 mL

## 2020-09-04 MED ORDER — MEPERIDINE HCL 25 MG/ML IJ SOLN: 6.2500 mg | INTRAMUSCULAR | Status: DC | PRN

## 2020-09-04 MED ORDER — CEFAZOLIN IN SODIUM CHLORIDE 3-0.9 GM/100ML-% IV SOLN
3.0000 g | INTRAVENOUS | Status: AC
  Administered 2020-09-04: 3 g via INTRAVENOUS
  Filled 2020-09-04: qty 100

## 2020-09-04 MED ORDER — MIDAZOLAM HCL 2 MG/2ML IJ SOLN
INTRAMUSCULAR | Status: DC | PRN
  Administered 2020-09-04: 1 mg via INTRAVENOUS

## 2020-09-04 MED ORDER — FENTANYL CITRATE (PF) 100 MCG/2ML IJ SOLN
INTRAMUSCULAR | Status: DC | PRN
  Administered 2020-09-04 (×2): 50 ug via INTRAVENOUS

## 2020-09-04 MED ORDER — SCOPOLAMINE 1 MG/3DAYS TD PT72
1.0000 | MEDICATED_PATCH | TRANSDERMAL | Status: DC
  Administered 2020-09-04: 1.5 mg via TRANSDERMAL

## 2020-09-04 MED ORDER — ONDANSETRON HCL 4 MG/2ML IJ SOLN
INTRAMUSCULAR | Status: DC | PRN
  Administered 2020-09-04: 4 mg via INTRAVENOUS

## 2020-09-04 MED ORDER — SCOPOLAMINE 1 MG/3DAYS TD PT72
MEDICATED_PATCH | TRANSDERMAL | Status: AC
  Filled 2020-09-04: qty 1

## 2020-09-04 MED ORDER — OXYCODONE HCL 5 MG/5ML PO SOLN: 5.0000 mg | Freq: Once | ORAL | Status: DC | PRN

## 2020-09-04 MED ORDER — PROMETHAZINE HCL 25 MG/ML IJ SOLN: 6.2500 mg | INTRAMUSCULAR | Status: DC | PRN

## 2020-09-04 MED ORDER — MIDAZOLAM HCL 2 MG/2ML IJ SOLN
INTRAMUSCULAR | Status: AC
  Filled 2020-09-04: qty 2

## 2020-09-04 MED ORDER — MIDAZOLAM HCL 2 MG/2ML IJ SOLN: 0.5000 mg | Freq: Once | INTRAMUSCULAR | Status: DC | PRN

## 2020-09-04 MED ORDER — FENTANYL CITRATE (PF) 100 MCG/2ML IJ SOLN
25.0000 ug | INTRAMUSCULAR | Status: DC | PRN
  Administered 2020-09-04: 25 ug via INTRAVENOUS

## 2020-09-04 MED ORDER — LIDOCAINE HCL (CARDIAC) PF 100 MG/5ML IV SOSY
PREFILLED_SYRINGE | INTRAVENOUS | Status: DC | PRN
  Administered 2020-09-04: 20 mg via INTRAVENOUS

## 2020-09-04 MED ORDER — KETOROLAC TROMETHAMINE 30 MG/ML IJ SOLN
INTRAMUSCULAR | Status: DC | PRN
Start: 2020-09-04 — End: 2020-09-04
  Administered 2020-09-04: 30 mg via INTRAVENOUS

## 2020-09-04 MED ORDER — ACETAMINOPHEN 500 MG PO TABS
1000.0000 mg | ORAL_TABLET | Freq: Once | ORAL | Status: AC
  Administered 2020-09-04: 1000 mg via ORAL

## 2020-09-04 MED ORDER — PROPOFOL 10 MG/ML IV BOLUS
INTRAVENOUS | Status: AC
  Filled 2020-09-04: qty 40

## 2020-09-04 MED ORDER — LACTATED RINGERS IV SOLN: INTRAVENOUS | Status: DC

## 2020-09-04 MED ORDER — GLYCOPYRROLATE 0.2 MG/ML IJ SOLN
INTRAMUSCULAR | Status: DC | PRN
  Administered 2020-09-04: .2 mg via INTRAVENOUS

## 2020-09-04 MED ORDER — OXYCODONE HCL 5 MG PO TABS: 5.0000 mg | ORAL_TABLET | Freq: Once | ORAL | Status: DC | PRN

## 2020-09-04 MED ORDER — ACETAMINOPHEN 500 MG PO TABS
ORAL_TABLET | ORAL | Status: AC
  Filled 2020-09-04: qty 2

## 2020-09-04 MED ORDER — SODIUM CHLORIDE 0.9 % IR SOLN
Status: DC | PRN
  Administered 2020-09-04: 3000 mL

## 2020-09-04 MED ORDER — POVIDONE-IODINE 10 % EX SWAB: 2.0000 "application " | Freq: Once | CUTANEOUS | Status: DC

## 2020-09-04 SURGICAL SUPPLY — 20 items
CATH ROBINSON RED A/P 16FR (CATHETERS) ×2 IMPLANT
DEVICE MYOSURE LITE (MISCELLANEOUS) IMPLANT
DEVICE MYOSURE REACH (MISCELLANEOUS) ×2 IMPLANT
DILATOR CANAL MILEX (MISCELLANEOUS) IMPLANT
ELECT REM PT RETURN 9FT ADLT (ELECTROSURGICAL)
ELECTRODE REM PT RTRN 9FT ADLT (ELECTROSURGICAL) IMPLANT
GAUZE 4X4 16PLY ~~LOC~~+RFID DBL (SPONGE) ×4 IMPLANT
GLOVE SURG ENC MOIS LTX SZ6.5 (GLOVE) ×2 IMPLANT
GLOVE SURG UNDER POLY LF SZ7 (GLOVE) ×10 IMPLANT
GOWN STRL REUS W/TWL LRG LVL3 (GOWN DISPOSABLE) ×4 IMPLANT
IV NS IRRIG 3000ML ARTHROMATIC (IV SOLUTION) ×2 IMPLANT
KIT PROCEDURE FLUENT (KITS) ×2 IMPLANT
KIT TURNOVER CYSTO (KITS) ×2 IMPLANT
MYOSURE XL FIBROID (MISCELLANEOUS)
PACK VAGINAL MINOR WOMEN LF (CUSTOM PROCEDURE TRAY) ×2 IMPLANT
PAD OB MATERNITY 4.3X12.25 (PERSONAL CARE ITEMS) ×2 IMPLANT
PAD PREP 24X48 CUFFED NSTRL (MISCELLANEOUS) ×2 IMPLANT
SEAL CERVICAL OMNI LOK (ABLATOR) IMPLANT
SEAL ROD LENS SCOPE MYOSURE (ABLATOR) ×2 IMPLANT
SYSTEM TISS REMOVAL MYOSURE XL (MISCELLANEOUS) IMPLANT

## 2020-09-04 NOTE — Anesthesia Postprocedure Evaluation (Signed)
Anesthesia Post Note  Patient: Kayla Wilkins  Procedure(s) Performed: DILATATION & CURETTAGE/HYSTEROSCOPY WITH MYOSURE     Patient location during evaluation: PACU Anesthesia Type: General Level of consciousness: awake and alert, patient cooperative and oriented Pain management: pain level controlled Vital Signs Assessment: post-procedure vital signs reviewed and stable Respiratory status: spontaneous breathing, nonlabored ventilation and respiratory function stable Cardiovascular status: blood pressure returned to baseline and stable Postop Assessment: no apparent nausea or vomiting Anesthetic complications: no   No notable events documented.  Last Vitals:  Vitals:   09/04/20 0916 09/04/20 0930  BP: (!) 167/82 (!) 145/77  Pulse: (!) 26 67  Resp: 16 11  Temp: 36.5 C   SpO2: 100% 100%    Last Pain:  Vitals:   09/04/20 0930  TempSrc:   PainSc: 4                  Jahsiah Carpenter,E. Maddisyn Hegwood

## 2020-09-04 NOTE — Op Note (Signed)
Operative Note  09/04/2020  9:12 AM  PATIENT:  Kayla Wilkins  30 y.o. female  PRE-OPERATIVE DIAGNOSIS:  Menometrorrhagia, endometrial polyp  POST-OPERATIVE DIAGNOSIS:  Menometrorrhagia, endometrial polyp  PROCEDURE:  Procedure(s): DILATATION & CURETTAGE/HYSTEROSCOPY WITH MYOSURE EXCISION  SURGEON:  Surgeon(s): [name], MD  ANESTHESIA:   general  FINDINGS: Very small endometrial and endocervical polyps.  DESCRIPTION OF OPERATION: Under general anesthesia with laryngeal mask, the patient is in lithotomy position.  She is prepped with Betadine on the suprapubic, vulvar and vaginal areas.  The bladder is catheterized.  The patient is draped as usual.  Timeout is done.  The vaginal exam reveals a retroverted uterus, normal volume and mobile.  No adnexal mass.  The speculum is inserted into the vagina and the anterior lip of the cervix is grasped with a tenaculum.  A paracervical block is done with lidocaine 1% a total of 10 cc at 4 and 8:00.  Dilation of the cervix with Pratt dilators up to #19 without difficulty.  The hysteroscope is inserted in the intra uterine cavity.  Inspection reveals a very small endometrial polyp and an endocervical polyp.  Pictures are taken.  Both ostia are visualized and normal.  The reach MyoSure is inserted through the hysteroscope and excisions of the polyps are done.  Pictures are taken after.  Hemostasis is adequate.  The hysteroscope with MyoSure are removed.  We proceeded with a systematic curettage of the intra uterine cavity on all endometrial surfaces.  Both specimens are sent together.  The curette is removed.  The tenaculum was removed from the cervix.  Hemostasis is adequate at that level.  The speculum is removed.  The patient is brought to recovery room in good and stable status.  ESTIMATED BLOOD LOSS: 5 mL Fluid Deficit: 170 mL  Intake/Output Summary (Last 24 hours) at 09/04/2020 0912 Last data filed at 09/04/2020 9678 Gross per 24 hour   Intake --  Output 5 ml  Net -5 ml     BLOOD ADMINISTERED:none   LOCAL MEDICATIONS USED:  LIDOCAINE 1% 10 cc  SPECIMEN:  Source of Specimen:  Endometrium/Polyps  DISPOSITION OF SPECIMEN:  PATHOLOGY  COUNTS:  YES  PLAN OF CARE: Transfer to PACU  Marie-Lyne LavoieMD9:12 AM

## 2020-09-04 NOTE — Discharge Instructions (Addendum)
DISCHARGE INSTRUCTIONS: D&C / Myosure The following instructions have been prepared to help you care for yourself upon your return home.   **You may begin taking Ibuprofen after 3:00 pm today**  Personal hygiene:  Use sanitary pads for vaginal drainage, not tampons.  Shower the day after your procedure.  NO tub baths, pools or Jacuzzis for 2-3 weeks.  Wipe front to back after using the bathroom.  Activity and limitations:  Do NOT drive or operate any equipment for 24 hours. The effects of anesthesia are still present and drowsiness may result.  Do NOT rest in bed all day.  Walking is encouraged.  Walk up and down stairs slowly.  You may resume your normal activity in one to two days or as indicated by your physician.  Sexual activity: NO intercourse for at least 2 weeks after the procedure, or as indicated by your physician.  Diet: Eat a light meal as desired this evening. You may resume your usual diet tomorrow.  Return to work: You may resume your work activities in one to two days or as indicated by your doctor.  What to expect after your surgery: Expect to have vaginal bleeding/discharge for 2-3 days and spotting for up to 10 days. It is not unusual to have soreness for up to 1-2 weeks. You may have a slight burning sensation when you urinate for the first day. Mild cramps may continue for a couple of days. You may have a regular period in 2-6 weeks.  Call your doctor for any of the following:  Excessive vaginal bleeding, saturating and changing one pad every hour.  Inability to urinate 6 hours after discharge from hospital.  Pain not relieved by pain medication.  Fever of 100.4 F or greater.  Unusual vaginal discharge or odor.      Post Anesthesia Home Care Instructions  Activity: Get plenty of rest for the remainder of the day. A responsible adult should stay with you for 24 hours following the procedure.  For the next 24 hours, DO NOT: -Drive a car -Social worker -Drink alcoholic beverages -Take any medication unless instructed by your physician -Make any legal decisions or sign important papers.  Meals: Start with liquid foods such as gelatin or soup. Progress to regular foods as tolerated. Avoid greasy, spicy, heavy foods. If nausea and/or vomiting occur, drink only clear liquids until the nausea and/or vomiting subsides. Call your physician if vomiting continues.  Special Instructions/Symptoms: Your throat may feel dry or sore from the anesthesia or the breathing tube placed in your throat during surgery. If this causes discomfort, gargle with warm salt water. The discomfort should disappear within 24 hours.  If you had a scopolamine patch placed behind your ear for the management of post- operative nausea and/or vomiting:  1. The medication in the patch is effective for 72 hours, after which it should be removed.  Wrap patch in a tissue and discard in the trash. Wash hands thoroughly with soap and water. 2. You may remove the patch earlier than 72 hours if you experience unpleasant side effects which may include dry mouth, dizziness or visual disturbances. 3. Avoid touching the patch. Wash your hands with soap and water after contact with the patch.

## 2020-09-04 NOTE — H&P (Signed)
Kayla Wilkins is an 30 y.o. female.G0   RP: HSC/Myosure Excision/D+C    HPI: No change x last visit at the office on 08/22/2020.  Well on Megace.  No heavy vaginal bleeding.  No pelvic pain.   Pertinent Gynecological History: Menses:  Menometrorrhagia Contraception: oral progesterone-only contraceptive Blood transfusions: none Sexually transmitted diseases: no past history Last pap: normal  OB History: G0   Menstrual History:  Patient's last menstrual period was 09/03/2020 (exact date).    Past Medical History:  Diagnosis Date   ADD (attention deficit disorder)    Anxiety    Bilateral ovarian cysts    Chiari malformation type I (HCC) 2017   last head CT in epic 04-19-2019   Depression    Endometrial polyp    Headache    History of COVID-19 01/2020   per pt mild to moderate symptoms that resolved   History of palpitations    had cardiology work-up done by dr b. 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    Past Surgical History:  Procedure Laterality Date   CHOLECYSTECTOMY, LAPAROSCOPIC  2009   LAPAROSCOPIC GASTRIC SLEEVE RESECTION  01/14/2015   @ [facility]    Family History  Problem Relation Age of Onset   Varicose Veins Mother    Hypertension Mother    Depression Mother    Anxiety disorder Mother    Diabetes Paternal Aunt    Arthritis Maternal Grandmother    Alzheimer's disease Maternal Grandmother    Stroke Maternal Grandmother    Cancer Maternal Grandfather        lung   Schizophrenia Maternal Grandfather    Schizophrenia Father    Multiple sclerosis Maternal Aunt    Colon cancer Maternal Aunt     Social History:  reports that she quit smoking about 13 years ago. Her smoking use included cigarettes. She has never used smokeless tobacco. She reports current alcohol use of about 3.0 - 4.0 standard drinks  per week. She reports that she does not currently use drugs after having used the following drugs: Marijuana.  Allergies: No Known Allergies  Medications Prior to Admission  Medication Sig Dispense Refill Last Dose   acetaminophen (TYLENOL) 500 MG tablet Take 500 mg by mouth every 6 (six) hours as needed.   Past Week   amphetamine-dextroamphetamine (ADDERALL) 15 MG tablet Take 15 mg by mouth in the morning and at bedtime.   09/03/2020   aspirin-acetaminophen-caffeine (EXCEDRIN MIGRAINE) 250-250-65 MG tablet Take by mouth every 6 (six) hours as needed for headache.   Past Week   desvenlafaxine (PRISTIQ) 50 MG 24 hr tablet Take 50 mg by mouth at bedtime.   09/03/2020   Ibuprofen 200 MG CAPS Take by mouth as needed.   Past Week   megestrol (MEGACE) 40 MG tablet Take 40 mg by mouth daily. Starts first day of menses   Past Month   norethindrone (MICRONOR) 0.35 MG tablet Take 1 tablet (0.35 mg total) by mouth daily. (Patient taking differently: Take 1 tablet by mouth daily.) 84 tablet 4 09/03/2020    REVIEW OF SYSTEMS: A ROS was performed and pertinent positives and negatives are included in the history.  GENERAL: No fevers or chills. HEENT: No change in vision, no earache, sore throat or sinus congestion. NECK: No pain or stiffness. CARDIOVASCULAR: No chest pain or pressure. No palpitations. PULMONARY: No  shortness of breath, cough or wheeze. GASTROINTESTINAL: No abdominal pain, nausea, vomiting or diarrhea, melena or bright red blood per rectum. GENITOURINARY: No urinary frequency, urgency, hesitancy or dysuria. MUSCULOSKELETAL: No joint or muscle pain, no back pain, no recent trauma. DERMATOLOGIC: No rash, no itching, no lesions. ENDOCRINE: No polyuria, polydipsia, no heat or cold intolerance. No recent change in weight. HEMATOLOGICAL: No anemia or easy bruising or bleeding. NEUROLOGIC: No headache, seizures, numbness, tingling or weakness. PSYCHIATRIC: No depression, no loss of interest in normal activity  or change in sleep pattern.     Blood pressure (!) 154/90, pulse 76, temperature 98.9 F (37.2 C), temperature source Oral, resp. rate 16, height 5\' 9"  (1.753 m), weight 122 kg, last menstrual period 09/03/2020, SpO2 98 %.  Physical Exam:  RCR Lungs clear  Pelvic 09/05/2020 06/26/2020: T/V images.  Anteverted uterus measured at 7.49 x 5.81 x 4.11 cm.  1.8 cm subserosal fibroid is noted.  The endometrial lining is measured at 7.22 mm.  Hypervascular area measured at 1.3 x 0.9 cm is seen which is compatible with an endometrial polyp.  Both ovaries are normal in size with normal follicular pattern.  No adnexal mass seen.  No free fluid in the posterior cul-de-sac.    Results for orders placed or performed during the hospital encounter of 09/04/20 (from the past 24 hour(s))  Pregnancy, urine POC     Status: None   Collection Time: 09/04/20  6:56 AM  Result Value Ref Range   Preg Test, Ur NEGATIVE NEGATIVE  CBC     Status: Abnormal   Collection Time: 09/04/20  7:11 AM  Result Value Ref Range   WBC 7.9 4.0 - 10.5 K/uL   RBC 4.40 3.87 - 5.11 MIL/uL   Hemoglobin 14.8 12.0 - 15.0 g/dL   HCT 09/06/20 81.4 - 48.1 %   MCV 97.3 80.0 - 100.0 fL   MCH 33.6 26.0 - 34.0 pg   MCHC 34.6 30.0 - 36.0 g/dL   RDW 85.6 31.4 - 97.0 %   Platelets 410 (H) 150 - 400 K/uL   nRBC 0.0 0.0 - 0.2 %     Assessment/Plan:  30 y.o. G0   1. Menometrorrhagia Menometrorrhagia with decreased flow on Megace.  Pelvic ultrasound showing a thickened endometrial lining with a probable 1.3 cm polyp.  Decision to proceed with hysteroscopy, MyoSure excision and D&C.  Surgical procedure, risks and benefits thoroughly reviewed with patient.  Preop preparation and postop precautions discussed.  Patient voiced understanding and agreement with plan.   2. Endometrial polyp As above.   Other orders - amphetamine-dextroamphetamine (ADDERALL) 15 MG tablet; Take 15 mg by mouth in the morning and at bedtime. - desvenlafaxine (PRISTIQ) 50 MG 24  hr tablet; Take 50 mg by mouth daily. - megestrol (MEGACE) 40 MG tablet; Take 40 mg by mouth daily. Starts first day of menses                          Patient was counseled as to the risk of surgery to include the following:   1. Infection (prohylactic antibiotics will be administered)   2. DVT/Pulmonary Embolism (prophylactic pneumo compression stockings will be used)   3.Trauma to internal organs requiring additional surgical procedure to repair any injury to internal organs requiring perhaps additional hospitalization days.   4.Hemmorhage requiring transfusion and blood products which carry risks such as anaphylactic reaction, hepatitis and AIDS   Patient had received literature information on the  procedure scheduled and all her questions were answered and fully accepts all risk.    Marie-Lyne Mckenzie Toruno 09/04/2020, 7:52 AM

## 2020-09-04 NOTE — Anesthesia Procedure Notes (Signed)
Procedure Name: LMA Insertion Date/Time: 09/04/2020 8:46 AM Performed by: Earmon Phoenix, CRNA Pre-anesthesia Checklist: Patient identified, Emergency Drugs available, Suction available, Patient being monitored and Timeout performed Patient Re-evaluated:Patient Re-evaluated prior to induction Oxygen Delivery Method: Circle system utilized Preoxygenation: Pre-oxygenation with 100% oxygen Induction Type: IV induction Ventilation: Mask ventilation without difficulty LMA: LMA inserted LMA Size: 4.0 Number of attempts: 1 Tube secured with: Tape Dental Injury: Teeth and Oropharynx as per pre-operative assessment

## 2020-09-04 NOTE — Addendum Note (Signed)
Addendum  created 09/04/20 1003 by Earmon Phoenix, CRNA   Flowsheet accepted

## 2020-09-04 NOTE — Transfer of Care (Signed)
Immediate Anesthesia Transfer of Care Note  Patient: Kayla Wilkins  Procedure(s) Performed: DILATATION & CURETTAGE/HYSTEROSCOPY WITH MYOSURE  Patient Location: PACU  Anesthesia Type:General  Level of Consciousness: awake, alert  and oriented  Airway & Oxygen Therapy: Patient Spontanous Breathing and Patient connected to nasal cannula oxygen  Post-op Assessment: Report given to RN  Post vital signs: Reviewed and stable  Last Vitals:  Vitals Value Taken Time  BP 167/82   Temp    Pulse 81 09/04/20 0917  Resp 12 09/04/20 0917  SpO2 100 % 09/04/20 0917  Vitals shown include unvalidated device data.  Last Pain:  Vitals:   09/04/20 0720  TempSrc: Oral  PainSc: 2       Patients Stated Pain Goal: 4 (09/04/20 0720)  Complications: No notable events documented.

## 2020-09-05 ENCOUNTER — Encounter (HOSPITAL_BASED_OUTPATIENT_CLINIC_OR_DEPARTMENT_OTHER): Payer: Self-pay | Admitting: Obstetrics & Gynecology

## 2020-09-10 ENCOUNTER — Encounter: Payer: Self-pay | Admitting: Obstetrics & Gynecology

## 2020-09-12 ENCOUNTER — Encounter: Payer: Self-pay | Admitting: Obstetrics & Gynecology

## 2020-09-13 LAB — SURGICAL PATHOLOGY

## 2020-09-24 ENCOUNTER — Other Ambulatory Visit: Payer: Self-pay

## 2020-09-24 ENCOUNTER — Encounter: Payer: Self-pay | Admitting: Obstetrics & Gynecology

## 2020-09-24 ENCOUNTER — Ambulatory Visit (INDEPENDENT_AMBULATORY_CARE_PROVIDER_SITE_OTHER): Payer: 59 | Admitting: Obstetrics & Gynecology

## 2020-09-24 VITALS — BP 112/80 | HR 95 | Resp 16

## 2020-09-24 DIAGNOSIS — N921 Excessive and frequent menstruation with irregular cycle: Secondary | ICD-10-CM

## 2020-09-24 DIAGNOSIS — N840 Polyp of corpus uteri: Secondary | ICD-10-CM

## 2020-09-24 DIAGNOSIS — R6882 Decreased libido: Secondary | ICD-10-CM

## 2020-09-24 MED ORDER — MEDROXYPROGESTERONE ACETATE 5 MG PO TABS: 5.0000 mg | ORAL_TABLET | Freq: Every day | ORAL | 4 refills | Status: DC

## 2020-09-24 NOTE — Progress Notes (Signed)
    Kayla Wilkins Oct 11, 1990 [id number]        30 y.o.  G0P0000   RP: Postop HSC/Myosure Excision/D+C on 09/04/2020  HPI: Continued frequent vaginal bleeding since surgery.  No heavy bleeding. No pelvic pain.  No fever. Feels hot most of the time.  Low libido, not currently sexually active. Bisexual. Still on the Progestin-only BCPs.     OB History  Gravida Para Term Preterm AB Living  0 0 0 0 0 0  SAB IAB Ectopic Multiple Live Births  0 0 0 0 0    Past medical history,surgical history, problem list, medications, allergies, family history and social history were all reviewed and documented in the EPIC chart.   Directed ROS with pertinent positives and negatives documented in the history of present illness/assessment and plan.  Exam:  Vitals:   09/24/20 1502  BP: 112/80  Pulse: 95  Resp: 16   General appearance:  Normal  Abdomen: Normal  Gynecologic exam: Vulva normal.  Bimanual exam:  Uterus AV, volume wnl, mobile, NT.  No adnexal mass, NT.  Minimal blood, no blood clot, no abnormal discharge.  Patho 09/04/2020: FINAL MICROSCOPIC DIAGNOSIS:   A. ENDOMETRIAL POLYP AND CURETTINGS:  -  Benign endometrial polyp  -  Inactive endometrium  -  No hyperplasia or malignancy identified    Assessment/Plan:  30 y.o. G0P0000   1. Endometrial polyp Good postop healing.  No Cx.  Patho reviewed, benign.    2. Menometrorrhagia Cyclic Provera.  No CI.  Usage reviewed and prescription sent to pharmacy.  3. Low libido Stop the Progestin-only pill.  Will manage menstrual cycle with cyclic Provera as needed.  Will f/u with Dr Kelle Darting in 2 months if continues to feel hot with low libido and irregular bleeding.  Other orders - amphetamine-dextroamphetamine (ADDERALL) 7.5 MG tablet; Take by mouth 3 (three) times daily. - medroxyPROGESTERone (PROVERA) 5 MG tablet; Take 1 tablet (5 mg total) by mouth daily for 10 days. Cyclic Provera 1 tab PO daily x 10 days every month.    [name] MD, 3:24 PM 09/24/2020

## 2020-09-25 ENCOUNTER — Encounter: Payer: Self-pay | Admitting: Obstetrics & Gynecology

## 2020-10-15 ENCOUNTER — Ambulatory Visit (INDEPENDENT_AMBULATORY_CARE_PROVIDER_SITE_OTHER): Payer: 59 | Admitting: Internal Medicine

## 2020-10-15 ENCOUNTER — Other Ambulatory Visit: Payer: Self-pay

## 2020-10-15 ENCOUNTER — Encounter: Payer: Self-pay | Admitting: Internal Medicine

## 2020-10-15 VITALS — BP 128/88 | HR 105 | Ht 69.0 in | Wt 273.0 lb

## 2020-10-15 DIAGNOSIS — R5383 Other fatigue: Secondary | ICD-10-CM

## 2020-10-15 DIAGNOSIS — N915 Oligomenorrhea, unspecified: Secondary | ICD-10-CM | POA: Diagnosis not present

## 2020-10-15 NOTE — Patient Instructions (Addendum)
Please come back for labs in the 3rd-5th day of your next menstrual cycle.  We will schedule an appt if the labs are abnormal.   Please review the following materials:  Eating a Vegan Diet to Control Endometriosis Pain and Symptoms  [email or web address]   How a Plant-Based Diet Eased My Endometriosis  Kitchn [email or web address]   YBB Star Katherine: "A Vegan Diet Cured My Endometriosis an... [email or web address]   Paula: Cured Endometriosis  [facility] [email or web address]   Post - This EndoLife [email or web address]   From the above links, you can go to the PCRM si [employer].   The websites, Facebook page and the books for the Engine 2 diet [employer]) are very good.  Also "Forks over [employer] and "What the Health" documentaries are excellent.

## 2020-10-15 NOTE — Progress Notes (Signed)
Patient ID: Kayla Wilkins, female   DOB: 1990/09/19, 30 y.o.   MRN: [id number]   This visit occurred during the SARS-CoV-2 public health emergency.  Safety protocols were in place, including screening questions prior to the visit, additional usage of staff PPE, and extensive cleaning of exam room while observing appropriate contact time as indicated for disinfecting solutions.   HPI: Kayla Wilkins is a very pleasant 30 y.o. female, initially referred by Dr. Seymour Bars for oligomenorrhea.  Our last visit was 1 year and 7 months ago.  Interim history: I initially saw the patient for oligomenorrhea.  At last visit, we checked her hormonal status and this returns normal.  After this, she developed long menstrual cycles, lasting for 3 weeks for almost a year and ended up having a D&C for DUB (08/2020) -polyps were extracted.  She had more bleeding after the surgery and was placed on Megace.  After this, she was switched to an oral contraceptive, but she is now off.  Now she bleeds for 4 to 5 days every cycle but she has heavy bleeding. LMP: 7-8 days ago. Bled for 4-5 days. She also complains of mood swings, crying spells, lack of libido, increased sweating.  She also has anxiety with associated palpitations.  Reviewed history: Patient described that she started to have dizziness with exercising (running) in 04/2018. She had to stop running because of this. Afterwards, she started to gain weight and she estimates approximately 40 pounds.  She also describes that she had regular menstrual cycles all her life, however, in 06-07/2018, her menstrual cycles stopped. She has been started on Provera for 10 days by OB/GYN. She had 2 cycles of this >> had w/al bleeds on this x2.  She also described exhaustion, hair loss, rash on legs - psoriatiform, dizziness, lack of libido, vaginal dryness, urinary incontinence (urgency), migraines, hoarseness, palpitations, mm spasms.  She denied acne, but noticed more body hair,  losing hair of on eyebrows, no wide, purple stretch marks.  Pt described: - menarche at 57-12 y/o - regular menses until 06/2018-07/2018 - no h/o ovarian cysts per recent TV U/S - children: 0 - miscarriages: 0 - contraception: had Mirena IUDx 2 but they migrated around >> extracted ~2015; tried OCP >> weight gain; Depo provera >> significant weight gain  At last visit, we ruled out adrenal insufficiency -appropriately cosyntropin-stimulated cortisol: Component     Latest Ref Rng & Units 03/30/2019 03/30/2019 03/30/2019         3:10 PM  3:51 PM  4:29 PM  Cortisol, Plasma     ug/dL 9.1 47.4 25.9  D638 ACTH     6 - 50 pg/mL <5 (L)     Also, we ruled out Cushing syndrome -appropriately suppressed cortisol after dexamethasone: Component     Latest Ref Rng & Units 03/24/2019  Dexamethasone, Serum     ng/dL 756  Cortisol - AM     mcg/dL 0.8 (L)   Vitamin levels, testosterone, estradiol, 17 hydroxyprogesterone, DHEA-S, LH, FSH, thyroid antibodies and thyroid function tests have all been normal: 06/26/2020: FSH 4.5 Component     Latest Ref Rng & Units 03/14/2019  Testosterone, Serum (Total)     ng/dL 41  % Free Testosterone     % 0.9  Free Testosterone, S     pg/mL 3.7  Sex Hormone Binding Globulin     nmol/L 65.9  Estradiol, Free     pg/mL 5.51  Estradiol     pg/mL 276  TSH  0.35 - 4.50 uIU/mL 1.56  T4,Free(Direct)     0.60 - 1.60 ng/dL 4.16  Triiodothyronine,Free,Serum     2.3 - 4.2 pg/mL 3.2  DHEA-Sulfate, LCMS     ug/dL 606  [id number], LC/MS/MS      ng/dL 20  LH     mIU/mL 2.02  FSH     mIU/ML 3.8  Thyroperoxidase Ab SerPl-aCnc     <9 IU/mL <1  Thyroglobulin Ab     < or = 1 IU/mL <1  Vitamin B12     211 - 911 pg/mL 377  VITD     30.00 - 100.00 ng/mL 30.45   Other labs all: Component     Latest Ref Rng & Units 08/21/2015 11/03/2018  Preg Test, Ur     NEGATIVE Negative NEGATIVE  Preg, Serum       NEGATIVE  FSH     mIU/ML  2.4   -No history of  hyperprolactinemia: Lab Results  Component Value Date   PROLACTIN 7.8 11/03/2018   -No history of hypothyroidism: Lab Results  Component Value Date   TSH 1.99 06/26/2020   FREET4 0.98 03/19/2019   -+ Mild dyslipidemia; last set of lipids:    Component Value Date/Time   CHOL 97 02/09/2013 0925   TRIG 62 02/09/2013 0925   HDL 34 (L) 02/09/2013 0925   CHOLHDL 2.9 02/09/2013 0925   VLDL 12 02/09/2013 0925   LDLCALC 51 02/09/2013 0925   -No prediabetes/diabetes.  Last HbA1c: Lab Results  Component Value Date   HGBA1C 5.5 02/09/2013   She has h/o infertility in aunt. Also, PID, endometriosis, Uterine cancer in aunts. Early menopause in mother and GM.  She has a history of Chiari I malformation, obesity class III.  No steroids. She takes B complex, vitamin D, Glucosamine-Chondroitin, prev. Fish oil, MVI.   She started to work nights at the end of 12/2018.  ROS: Constitutional: + see HPI Cardiovascular: no CP/no SOB/+ palpitations/no leg swelling Respiratory: no cough/no SOB Gastrointestinal: no N/V/D/C Musculoskeletal: no muscle/no joint aches Skin: no acne, no hair on face, no dark discoloration of skin, + increased dry skin, + hair loss Psychiatric: no depression/+ anxiety + Low libido  Past Medical History:  Diagnosis Date   ADD (attention deficit disorder)    Anxiety    Bilateral ovarian cysts    Chiari malformation type I (HCC) 2017   last head CT in epic 04-19-2019   Depression    Endometrial polyp    Headache    History of COVID-19 01/2020   per pt mild to moderate symptoms that resolved   History of palpitations    had cardiology work-up done by dr b. 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  Tobacco Use   Smoking status: Former Smoker, quit in 2006    Types: Cigarettes   Smokeless tobacco: Never Used  Substance and Sexual Activity   Alcohol use: Yes    Comment: WINE OR BEER WITH DINNER 4 times a week   Drug use: No   Sexual activity: Yes    Partners: Male    Birth control/protection: Condom    Comment: 1st intercourse- 17, partners- 9, divorced  Other Topics Concern   Not on file  Social History Narrative   Not on file   Social Determinants of Health   Financial Resource Strain:    Difficulty of Paying Living Expenses: Not on file  Food Insecurity:    Worried About Programme researcher, broadcasting/film/video in the Last Year: Not on file   [employer] of Food in the Last Year: Not on file  Transportation Needs:    Lack of Transportation (Medical): Not on file   Lack of Transportation (Non-Medical): Not on file  Physical Activity:    Days of Exercise per Week: Not on file   Minutes of Exercise per Session: Not on file  Stress:    Feeling of Stress : Not on file  Social Connections:    Frequency of Communication with Friends and Family: Not on file   Frequency of Social Gatherings with Friends and Family: Not on file   Attends Religious Services: Not on file   Active Member of Clubs or Organizations: Not on file   Attends Banker Meetings: Not on file   Marital Status: Not on file  Intimate Partner Violence:     Fear of Current or Ex-Partner: Not on file   Emotionally Abused: Not on file   Physically Abused: Not on file   Sexually Abused: Not on file   Current Outpatient Medications on File Prior to Visit  Medication Sig Dispense Refill   acetaminophen (TYLENOL) 500 MG tablet Take 500 mg by mouth every 6 (six) hours as needed.     amphetamine-dextroamphetamine (ADDERALL) 7.5 MG tablet Take by mouth 3 (three) times daily.     aspirin-acetaminophen-caffeine (EXCEDRIN MIGRAINE) 250-250-65 MG tablet Take by mouth every 6 (six) hours as needed for headache.     desvenlafaxine (PRISTIQ) 50 MG 24 hr tablet Take 50 mg by mouth at bedtime.     Ibuprofen 200 MG CAPS Take by mouth as needed.     medroxyPROGESTERone (PROVERA) 5 MG tablet Take 1 tablet (5 mg total) by mouth daily for 10 days. Cyclic Provera 1 tab PO daily x 10 days every month. 30 tablet 4   No current facility-administered medications on file prior to visit.   No Known Allergies Family History  Problem Relation Age of Onset   Varicose Veins Mother    Hypertension Mother    Depression Mother    Anxiety disorder Mother    Diabetes Paternal Aunt    Arthritis Maternal Grandmother    Alzheimer's disease Maternal Grandmother    Stroke Maternal Grandmother    Cancer Maternal Grandfather        lung   Schizophrenia Maternal Grandfather    Schizophrenia Father    Multiple sclerosis Maternal Aunt    Colon cancer Maternal Aunt     PE: BP 128/88 (BP Location: Left Arm, Patient Position: Sitting, Cuff Size: Normal)   Pulse (!) 105   Ht 5\' 9"  (1.753 m)   Wt 273 lb (123.8 kg)  SpO2 97%   BMI 40.32 kg/m  Wt Readings from Last 3 Encounters:  10/15/20 273 lb (123.8 kg)  09/04/20 269 lb (122 kg)  08/22/20 270 lb 6.4 oz (122.7 kg)   Constitutional: Obese, in NAD, no full supraclavicular fat pads Eyes: PERRLA, EOMI, no exophthalmos ENT: moist mucous membranes, no thyromegaly, no cervical lymphadenopathy Cardiovascular: Tachycardia, RR, No  MRG Respiratory: CTA B Gastrointestinal: abdomen soft, NT, ND, BS+ Musculoskeletal: no deformities, strength intact in all 4 Skin: moist, warm; no acne on face, no dark terminal hair on chin, no acanthosis nigricans Neurological: no tremor with outstretched hands, DTR normal in all 4  ASSESSMENT: 1.  Heavy menstrual bleeding  2. Fatigue  PLAN: 1.  At last visit, she had oligomenorrhea.  At that time, we discussed about possible causes for this and ruled out pregnancy, hyperprolactinemia, hypo or hyper thyroidism, thyroid autoimmunity, PCOS, and C-CAH, autoimmune premature ovarian insufficiency, Cushing syndrome, and adrenal insufficiency. -At this visit, she describes that after our last visit, she actually had increase menstrual bleeding and she was mostly bleeding 3 weeks out of the month for almost a year until she had a D&C last month.  Even after the [facility], she continues to have increased flow with her menstrual cycles, but for shorter periods of time.  She also describes mood swings, irritability, crying spells, and low libido. -She has tried to adjust her diet to improve her menstrual cycles.  She is not drinking milk but eats some cheese.  She also started on an antidepressant.  However, these changes did not make too much of a difference. -At this visit, we discussed about checking her sex hormones in the 3rd-5th day of her next menstrual cycle -she will call us to schedule this -We will see her back if the results are abnormal -I also gave her a list of references for improving her diet even more to improve uterine bleeding.  2.  Fatigue -This is improved -No anemia, hypo or hyperthyroidism, adrenal insufficiency or Cushing's disease, low vitamins D and B12. -No apparent endocrine cause for this  Orders Placed This Encounter  Procedures   Luteinizing hormone   Follicle stimulating hormone   Estradiol   Testosterone Free with SHBG   Carlus Pavlov, MD PhD [facility]  Endocrinology

## 2020-11-08 ENCOUNTER — Telehealth: Payer: Self-pay | Admitting: *Deleted

## 2020-11-08 MED ORDER — LEVONORGESTREL 1.5 MG PO TABS: 1.5000 mg | ORAL_TABLET | Freq: Once | ORAL | 0 refills | Status: AC

## 2020-11-08 NOTE — Telephone Encounter (Signed)
Patient aware, Rx sent.  

## 2020-11-08 NOTE — Telephone Encounter (Signed)
Dr.Lavoie patient)  Patient called asking if Plan B pill can be sent to pharmacy? Reports last night the condom broke,patient is aware this pill is OTC, however she reports it cheaper with insurance. Please advise

## 2020-11-08 NOTE — Telephone Encounter (Signed)
Ok for Plan B, levonorgestrel, 1.5 mg po x 1.   Disp:  1 RF: none.

## 2020-11-08 NOTE — Telephone Encounter (Signed)
She would like Rx sent to [facility]. I updated pharmacy in chart.

## 2020-12-04 ENCOUNTER — Telehealth: Payer: Self-pay | Admitting: *Deleted

## 2020-12-04 NOTE — Telephone Encounter (Signed)
Patient called requesting Plan B pill,reports her insurance will cover medication if sent to pharmacy. Patient is over due for annual exam,I will have appointments reach to her to schedule. Please advise

## 2020-12-06 NOTE — Telephone Encounter (Signed)
Patient called as a follow up to Plan B message. I Left message for pt to return our call

## 2020-12-09 NOTE — Telephone Encounter (Signed)
Encounter closed patient never called back. 

## 2020-12-16 ENCOUNTER — Encounter: Payer: Self-pay | Admitting: Obstetrics & Gynecology

## 2020-12-23 ENCOUNTER — Ambulatory Visit: Payer: 59 | Admitting: Nurse Practitioner

## 2020-12-30 ENCOUNTER — Other Ambulatory Visit: Payer: Self-pay

## 2020-12-30 ENCOUNTER — Encounter: Payer: Self-pay | Admitting: Nurse Practitioner

## 2020-12-30 ENCOUNTER — Ambulatory Visit: Payer: 59 | Admitting: Nurse Practitioner

## 2020-12-30 VITALS — BP 134/80

## 2020-12-30 DIAGNOSIS — G43829 Menstrual migraine, not intractable, without status migrainosus: Secondary | ICD-10-CM

## 2020-12-30 DIAGNOSIS — N946 Dysmenorrhea, unspecified: Secondary | ICD-10-CM | POA: Diagnosis not present

## 2020-12-30 DIAGNOSIS — N920 Excessive and frequent menstruation with regular cycle: Secondary | ICD-10-CM | POA: Diagnosis not present

## 2020-12-30 DIAGNOSIS — Z30015 Encounter for initial prescription of vaginal ring hormonal contraceptive: Secondary | ICD-10-CM

## 2020-12-30 DIAGNOSIS — Z113 Encounter for screening for infections with a predominantly sexual mode of transmission: Secondary | ICD-10-CM | POA: Diagnosis not present

## 2020-12-30 MED ORDER — ETONOGESTREL-ETHINYL ESTRADIOL 0.12-0.015 MG/24HR VA RING: 1.0000 | VAGINAL_RING | VAGINAL | 3 refills | Status: DC

## 2020-12-30 NOTE — Progress Notes (Signed)
   Acute Office Visit  Subjective:    Patient ID: Kayla Wilkins, female    DOB: Jun 10, 1990, 30 y.o.   MRN: [id number]   HPI 30 y.o. presents today for dysmenorrhea and to discuss contraception. History of heavy, painful periods. 08/2020 D&C with removal of polyps. Prior to procedure she was having menstrual cycles that lasted 3 weeks. She continued to have more bleeding after surgery and was placed on Megace, then OCPs, then cyclic Provera. She is now experiencing heavy menstrual bleeding but for shorter periods lasting around 4-5 days with severe cramping. Cycles are mostly monthly (she had two in one month recently with second bleeding episode being light). Headaches are occurring with menses each month. She has been seen here and by endocrinology for cycles and other possible related symptoms. Previous contraception - IUD x 2 with migration, weight gain with Depo Provera and OCPs. Bisexual - recently became sexually active with female so she would like to discuss contraception as well. Would like STD testing today.    Review of Systems  Constitutional: Negative.   Genitourinary:  Positive for menstrual problem. Negative for dyspareunia and vaginal discharge.      Objective:    Physical Exam Constitutional:      Appearance: Normal appearance.  Genitourinary:    General: Normal vulva.     Vagina: Normal.     Cervix: Normal.     Uterus: Normal.     BP 134/80 (Cuff Size: Large)   LMP 11/23/2020  Wt Readings from Last 3 Encounters:  10/15/20 273 lb (123.8 kg)  09/04/20 269 lb (122 kg)  08/22/20 270 lb 6.4 oz (122.7 kg)        Assessment & Plan:   Problem List Items Addressed This Visit       Cardiovascular and Mediastinum   Migraine headache   Relevant Medications   etonogestrel-ethinyl estradiol (NUVARING) 0.12-0.015 MG/24HR vaginal ring   Other Visit Diagnoses     Encounter for initial prescription of vaginal ring hormonal contraceptive    -  Primary   Relevant  Medications   etonogestrel-ethinyl estradiol (NUVARING) 0.12-0.015 MG/24HR vaginal ring   Dysmenorrhea       Relevant Medications   etonogestrel-ethinyl estradiol (NUVARING) 0.12-0.015 MG/24HR vaginal ring   Screening examination for STD (sexually transmitted disease)       Relevant Orders   SURESWAB CT/NG/T. vaginalis   Menorrhagia with regular cycle       Relevant Medications   etonogestrel-ethinyl estradiol (NUVARING) 0.12-0.015 MG/24HR vaginal ring       Plan: We discussed options for managing heavy bleeding, dysmenorrhea and menstrual migraines with hormonal contraception. We are limited based on her history of contraceptive experience and preferences. She is agreeable to try Nuvaring continuously for cycle suppression. She will start these with next menses. Also discussed option of endometrial ablation and BTL if no success with vaginal ring. She has been considering BTL anyway as she has no desire for children. STD panel pending.    [name] DNP, 6:01 PM 12/30/2020

## 2021-01-01 LAB — SURESWAB CT/NG/T. VAGINALIS
C. trachomatis RNA, TMA: NOT DETECTED
N. gonorrhoeae RNA, TMA: NOT DETECTED
Trichomonas vaginalis RNA: NOT DETECTED

## 2021-01-20 ENCOUNTER — Encounter: Payer: Self-pay | Admitting: Nurse Practitioner

## 2021-02-02 IMAGING — CT CT HEAD W/O CM
3 series · 15 of 47 positions shown, 18 images · non-contrast
Comparison: MRI brain dated 10/14/2018

CLINICAL DATA: Headache, migraine behind right eye

EXAM:
CT HEAD WITHOUT CONTRAST
TECHNIQUE: Contiguous axial images were obtained from the base of the skull
through the vertex without intravenous contrast.

[Series 3: head 5.0 h30s · axial · 0.44mm/px · z∈[-109,+21]mm · 9 of 32 slices shown, 12 images]
[im 3/32  brain]
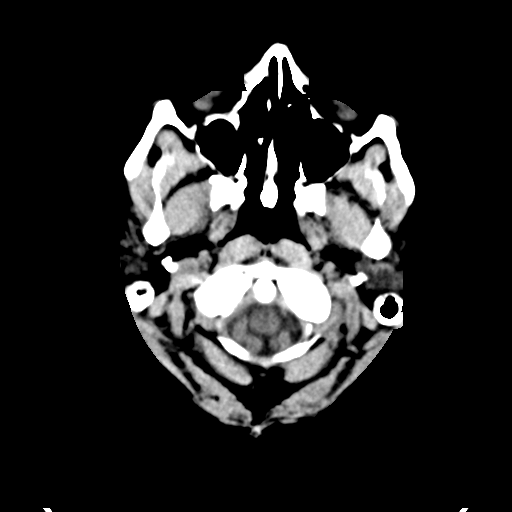
[im 3/32  bone]
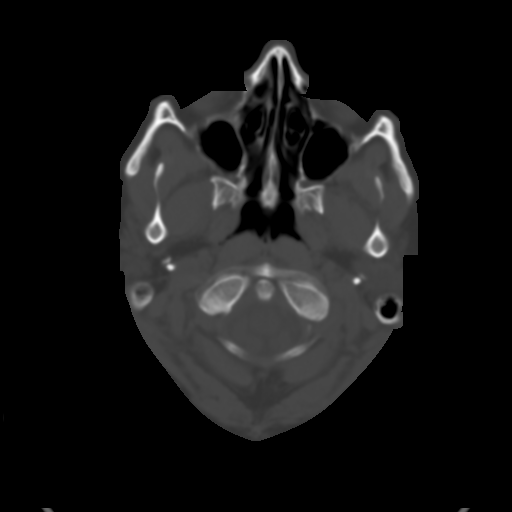
[im 6/32  brain]
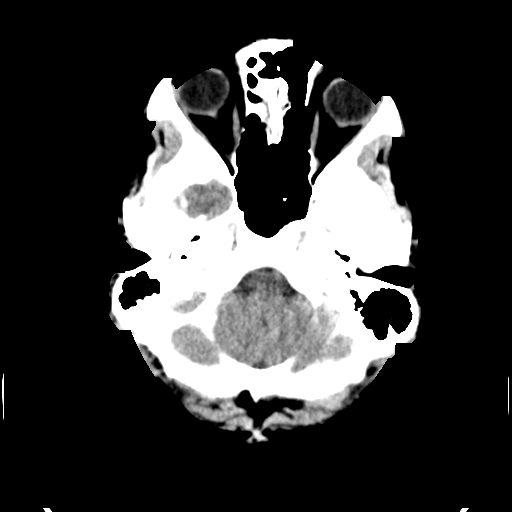
[im 9/32  brain]
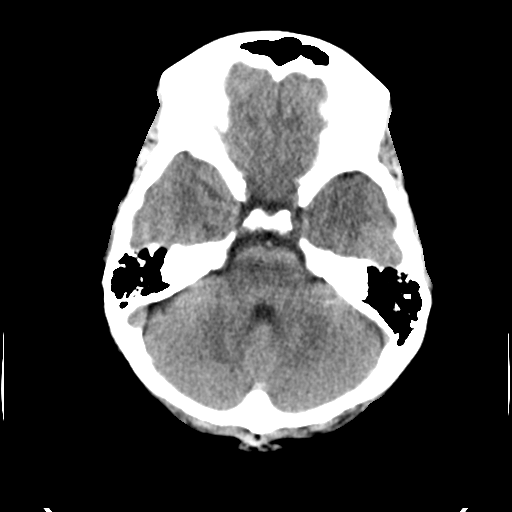
[im 12/32  brain]
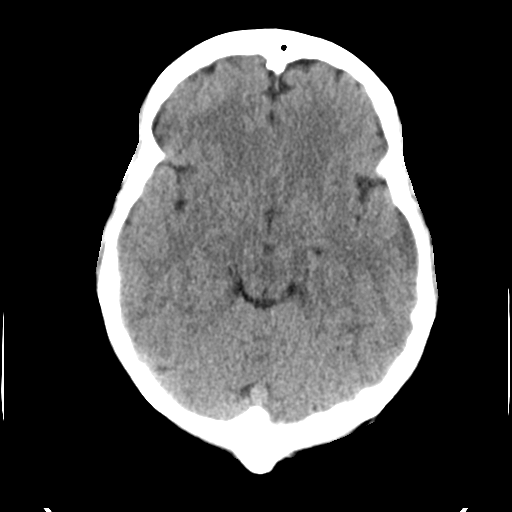
[im 17/32  brain]
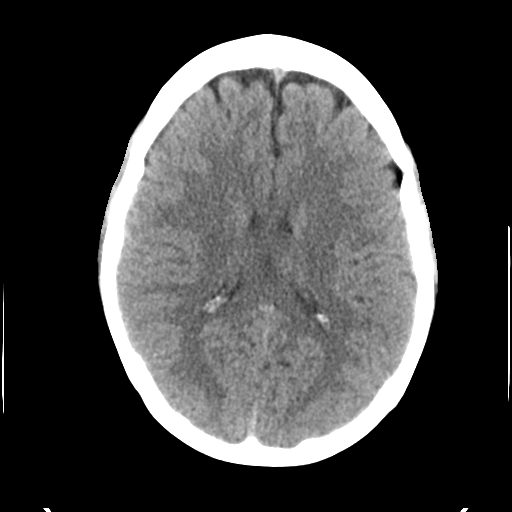
[im 17/32  bone]
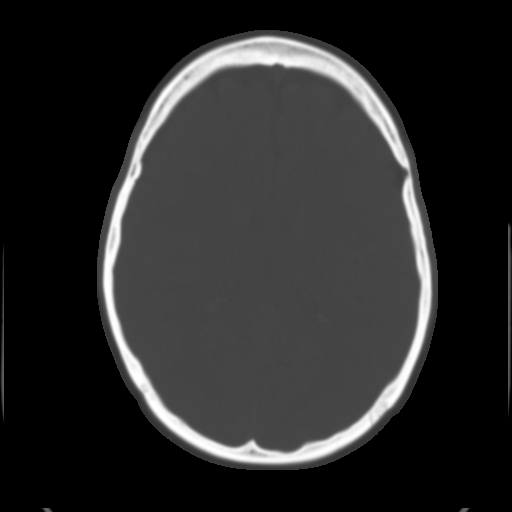
[im 20/32  brain]
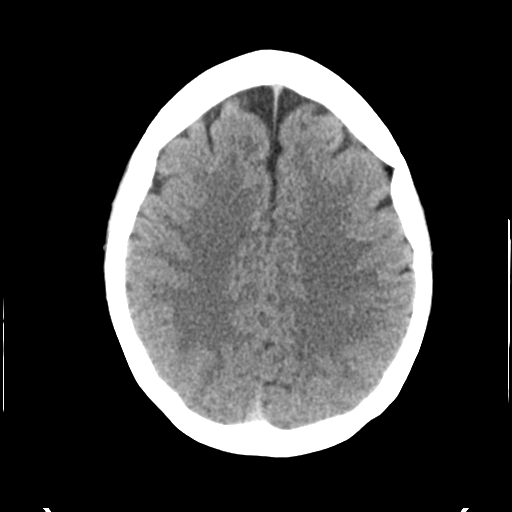
[im 23/32  brain]
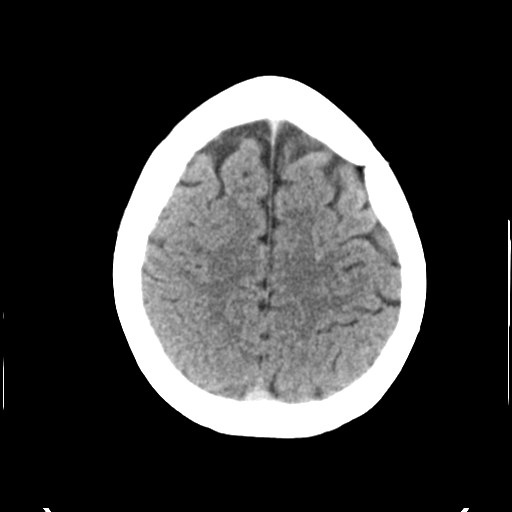
[im 26/32  brain]
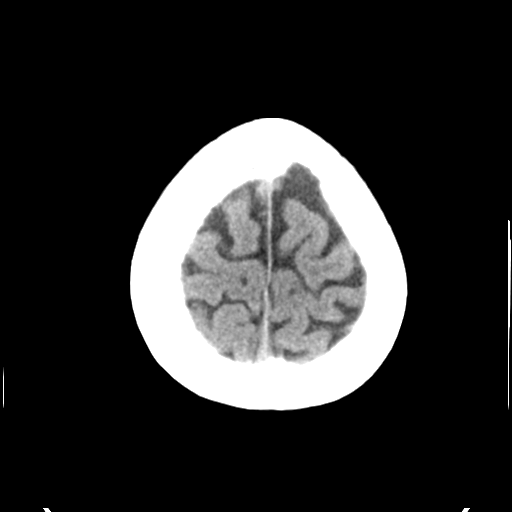
[im 29/32  brain]
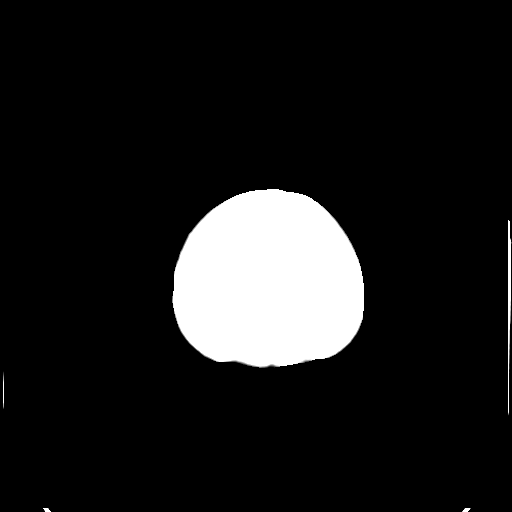
[im 29/32  bone]
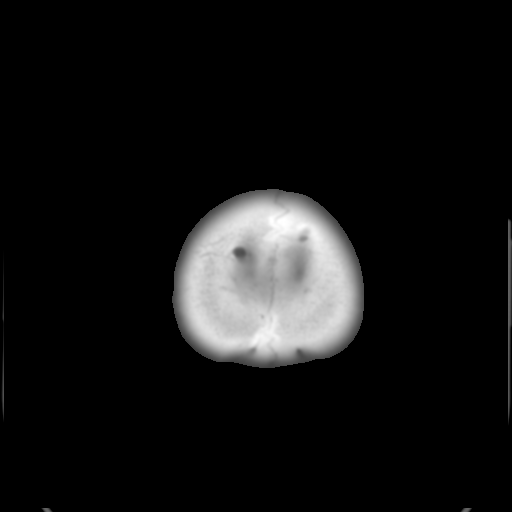

[Series 5: head 3.0 mpr cor · coronal · 0.31mm/px · 3 of 67 slices shown]
[im 23/67  brain]
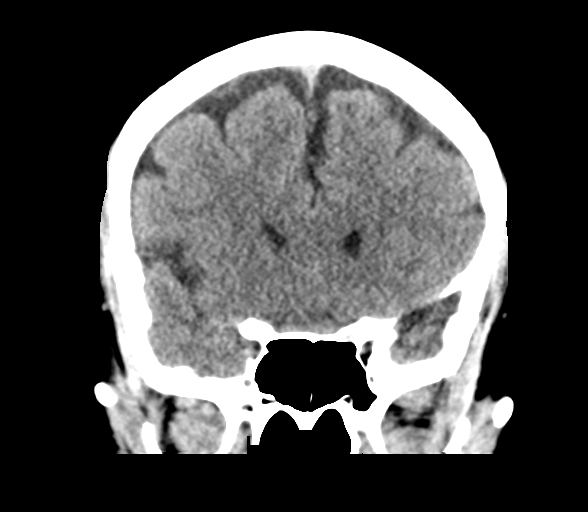
[im 30/67  brain]
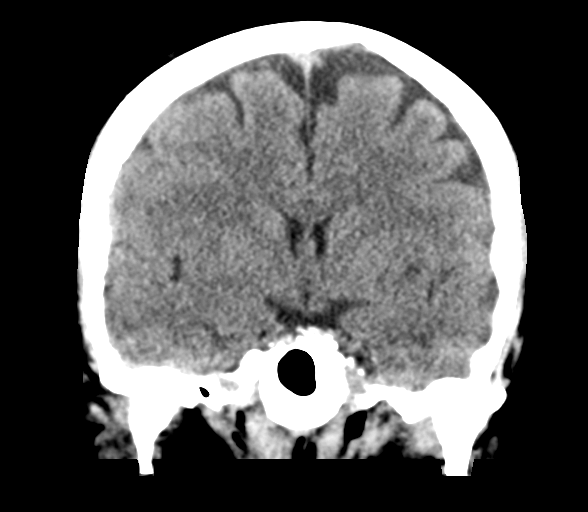
[im 37/67  brain]
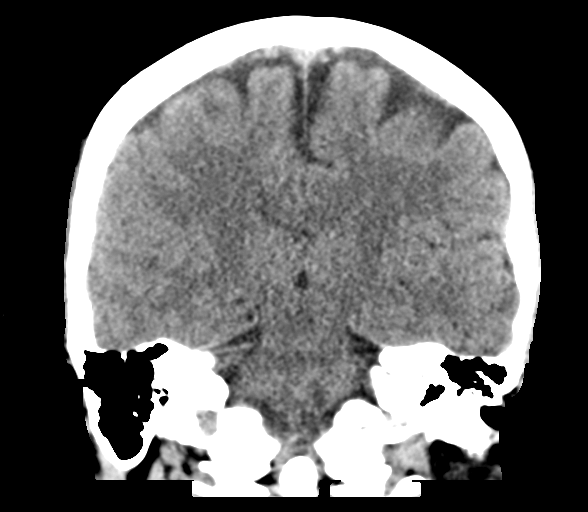

[Series 6: head 3.0 mpr sag · sagittal · 0.30mm/px · 3 of 53 slices shown]
[im 18/53  brain]
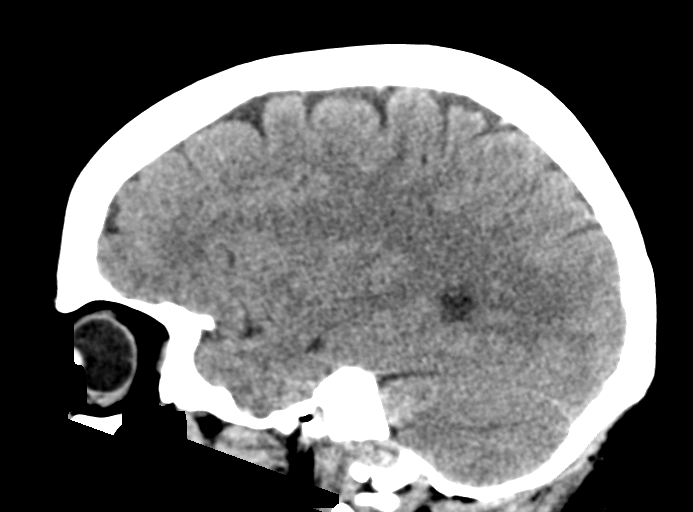
[im 27/53  brain]
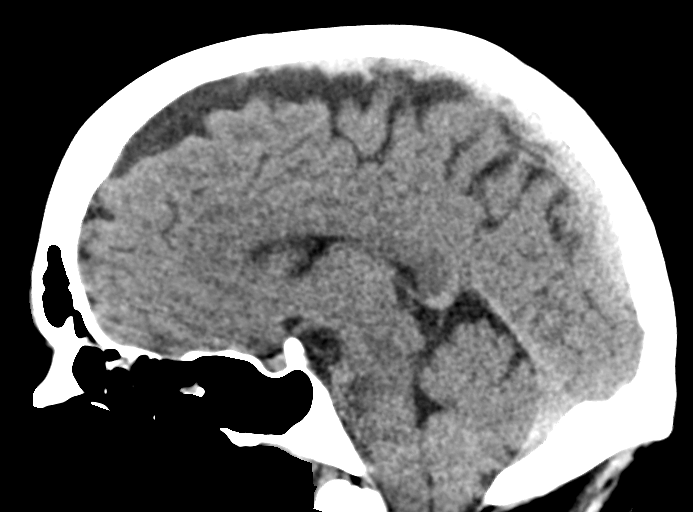
[im 35/53  brain]
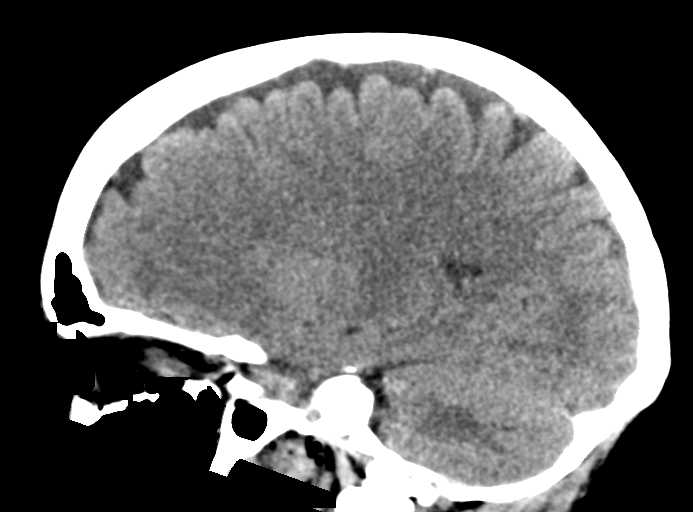

[15 of 47 positions shown; findings below may reference images not displayed]

FINDINGS: Brain: No evidence of acute infarction, hemorrhage, hydrocephalus,
extra-axial collection or mass lesion/mass effect.

Low-lying cerebellar tonsils, better evaluated on MRI.

Vascular: No hyperdense vessel or unexpected calcification.

Skull: Normal. Negative for fracture or focal lesion.

Sinuses/Orbits: The visualized paranasal sinuses are essentially
clear. The mastoid air cells are unopacified.

Other: None.
IMPRESSION: Low-lying cerebellar tonsils, corresponding to known Chiari
malformation on prior MRI.

Otherwise normal head CT.

## 2021-02-03 ENCOUNTER — Other Ambulatory Visit: Payer: Self-pay | Admitting: Nurse Practitioner

## 2021-02-03 DIAGNOSIS — N946 Dysmenorrhea, unspecified: Secondary | ICD-10-CM

## 2021-02-03 DIAGNOSIS — Z30011 Encounter for initial prescription of contraceptive pills: Secondary | ICD-10-CM

## 2021-02-03 MED ORDER — NORETHINDRONE 0.35 MG PO TABS: 1.0000 | ORAL_TABLET | Freq: Every day | ORAL | 1 refills | Status: DC

## 2021-03-20 ENCOUNTER — Other Ambulatory Visit (HOSPITAL_COMMUNITY)
Admission: RE | Admit: 2021-03-20 | Discharge: 2021-03-20 | Disposition: A | Discharge status: Home / Self Care | Payer: 59 | Source: Ambulatory Visit | Attending: Obstetrics & Gynecology | Admitting: Obstetrics & Gynecology

## 2021-03-20 ENCOUNTER — Other Ambulatory Visit: Payer: Self-pay

## 2021-03-20 ENCOUNTER — Ambulatory Visit (INDEPENDENT_AMBULATORY_CARE_PROVIDER_SITE_OTHER): Payer: 59 | Admitting: Obstetrics & Gynecology

## 2021-03-20 ENCOUNTER — Encounter: Payer: Self-pay | Admitting: Obstetrics & Gynecology

## 2021-03-20 VITALS — BP 110/80 | HR 74 | Resp 16 | Ht 67.75 in | Wt 259.0 lb

## 2021-03-20 DIAGNOSIS — E6609 Other obesity due to excess calories: Secondary | ICD-10-CM | POA: Diagnosis not present

## 2021-03-20 DIAGNOSIS — Z01419 Encounter for gynecological examination (general) (routine) without abnormal findings: Secondary | ICD-10-CM | POA: Diagnosis not present

## 2021-03-20 DIAGNOSIS — N946 Dysmenorrhea, unspecified: Secondary | ICD-10-CM

## 2021-03-20 DIAGNOSIS — Z789 Other specified health status: Secondary | ICD-10-CM | POA: Diagnosis not present

## 2021-03-20 DIAGNOSIS — Z6839 Body mass index (BMI) 39.0-39.9, adult: Secondary | ICD-10-CM

## 2021-03-20 NOTE — Progress Notes (Signed)
? ? ?Kayla Wilkins 1990/11/09 [id number] ? ? ?History:    31 y.o. G0 ? ?RP:  Established patient presenting for annual gyn exam  ? ?HPI: Menses regular normal every month with normal flow.  Still experiencing dysmenorrhea.  No BTB.  No pelvic pain. Bisexual.  Uses condoms for contraception.  Breasts normal.  Urine/BMs normal.  BMI 39.57.  [facility] with Fam MD. ? ?Past medical history,surgical history, family history and social history were all reviewed and documented in the EPIC chart. ? ?Gynecologic History ?Patient's last menstrual period was 03/14/2021 (exact date). ? ?Obstetric History ?OB History  ?Gravida Para Term Preterm AB Living  ?0 0 0 0 0 0  ?SAB IAB Ectopic Multiple Live Births  ?0 0 0 0 0  ? ? ? ?ROS: A ROS was performed and pertinent positives and negatives are included in the history. ? GENERAL: No fevers or chills. HEENT: No change in vision, no earache, sore throat or sinus congestion. NECK: No pain or stiffness. CARDIOVASCULAR: No chest pain or pressure. No palpitations. PULMONARY: No shortness of breath, cough or wheeze. GASTROINTESTINAL: No abdominal pain, nausea, vomiting or diarrhea, melena or bright red blood per rectum. GENITOURINARY: No urinary frequency, urgency, hesitancy or dysuria. MUSCULOSKELETAL: No joint or muscle pain, no back pain, no recent trauma. DERMATOLOGIC: No rash, no itching, no lesions. ENDOCRINE: No polyuria, polydipsia, no heat or cold intolerance. No recent change in weight. HEMATOLOGICAL: No anemia or easy bruising or bleeding. NEUROLOGIC: No headache, seizures, numbness, tingling or weakness. PSYCHIATRIC: No depression, no loss of interest in normal activity or change in sleep pattern.  ?  ? ?Exam: ? ? ?BP 110/80   Pulse 74   Resp 16   Ht 5' 7.75" (1.721 m)   Wt 259 lb (117.5 kg)   LMP 03/14/2021 (Exact Date)   BMI 39.67 kg/m?  ? ?Body mass index is 39.67 kg/m?. ? ?General appearance : Well developed well nourished female. No acute distress ?HEENT: Eyes: no  retinal hemorrhage or exudates,  Neck supple, trachea midline, no carotid bruits, no thyroidmegaly ?Lungs: Clear to auscultation, no rhonchi or wheezes, or rib retractions  ?Heart: Regular rate and rhythm, no murmurs or gallops ?Breast:Examined in sitting and supine position were symmetrical in appearance, no palpable masses or tenderness,  no skin retraction, no nipple inversion, no nipple discharge, no skin discoloration, no axillary or supraclavicular lymphadenopathy ?Abdomen: no palpable masses or tenderness, no rebound or guarding ?Extremities: no edema or skin discoloration or tenderness ? ?Pelvic: Vulva: Normal ?            Vagina: No gross lesions or discharge ? Cervix: No gross lesions or discharge.  Pap reflex done. ? Uterus  AV, normal size, shape and consistency, non-tender and mobile ? Adnexa  Without masses or tenderness ? Anus: Normal ? ? ?Assessment/Plan:  31 y.o. female for annual exam  ? ?1. Encounter for routine gynecological examination with Papanicolaou smear of cervix ?Menses regular normal every month with normal flow.  Still experiencing dysmenorrhea.  No BTB.  No pelvic pain. Bisexual.  Uses condoms for contraception.  Breasts normal.  Urine/BMs normal.  BMI 39.57.  [facility] with Fam MD. ?- Cytology - PAP( [facility]) ? ?2. Use of condoms for contraception ?Bisexual.  Condoms as needed. ? ?3. Dysmenorrhea ?Ibuprofen regularly during menses. ? ?4. Class 2 obesity due to excess calories without serious comorbidity with body mass index (BMI) of 39.0 to 39.9 in adult ?Lost 20 Lbs recently on a low  calorie diet.  Increased fitness activities and weight lifting. ? ?Other orders ?- metoprolol succinate (TOPROL-XL) 50 MG 24 hr tablet; Take 50 mg by mouth daily.  ? ?[name] MD, 1:48 PM 03/20/2021 ? ?  ?

## 2021-03-21 LAB — CYTOLOGY - PAP: Diagnosis: NEGATIVE

## 2021-03-25 ENCOUNTER — Encounter: Payer: Self-pay | Admitting: Obstetrics & Gynecology

## 2021-04-11 ENCOUNTER — Other Ambulatory Visit: Payer: Self-pay | Admitting: Radiology

## 2021-04-11 ENCOUNTER — Other Ambulatory Visit: Payer: Self-pay

## 2021-04-11 DIAGNOSIS — Z789 Other specified health status: Secondary | ICD-10-CM

## 2021-04-11 MED ORDER — LEVONORGESTREL 1.5 MG PO TABS: 1.5000 mg | ORAL_TABLET | Freq: Once | ORAL | 0 refills | Status: AC

## 2021-04-11 MED ORDER — ULIPRISTAL ACETATE 30 MG PO TABS: 30.0000 mg | ORAL_TABLET | Freq: Once | ORAL | Status: DC

## 2021-04-11 NOTE — Telephone Encounter (Signed)
Rx sent for ella (works better in those over 170lbs). If pharmacy does not have may substitute plan B. Offer appointment for [facility] counseling/management.

## 2021-04-11 NOTE — Telephone Encounter (Signed)
Pt notified and voiced understanding. Will call if she wants to schedule to discuss.  ?

## 2021-04-11 NOTE — Telephone Encounter (Signed)
ML pt calling to request a plan B Rx be sent to pharmacy.  ? ?AEX just this month so is UTD.  ?

## 2021-05-13 ENCOUNTER — Other Ambulatory Visit: Payer: Self-pay | Admitting: Nurse Practitioner

## 2021-05-13 DIAGNOSIS — N946 Dysmenorrhea, unspecified: Secondary | ICD-10-CM

## 2021-08-06 ENCOUNTER — Other Ambulatory Visit: Payer: Self-pay | Admitting: Nurse Practitioner

## 2021-08-06 DIAGNOSIS — N946 Dysmenorrhea, unspecified: Secondary | ICD-10-CM

## 2021-08-07 NOTE — Telephone Encounter (Signed)
Per 3/223 note "Uses condoms for contraception"

## 2021-08-19 ENCOUNTER — Telehealth: Payer: Self-pay | Admitting: *Deleted

## 2021-08-19 NOTE — Telephone Encounter (Signed)
Patient called requesting Rx for Plan B to be sent to pharmacy. Please advise

## 2021-08-22 NOTE — Telephone Encounter (Signed)
Patient informed to pick up Plan B OTC on 08/20/21. Patient wanted Rx because it would be free.

## 2021-09-05 ENCOUNTER — Other Ambulatory Visit: Payer: Self-pay | Admitting: *Deleted

## 2021-09-05 DIAGNOSIS — N946 Dysmenorrhea, unspecified: Secondary | ICD-10-CM

## 2021-09-05 MED ORDER — NORETHINDRONE 0.35 MG PO TABS: 1.0000 | ORAL_TABLET | Freq: Every day | ORAL | 1 refills | Status: DC

## 2021-09-05 NOTE — Telephone Encounter (Signed)
If she is willing to have it filled at [facility] she can get a 3 mos supply for $24 with goodrx

## 2021-09-05 NOTE — Telephone Encounter (Signed)
Kayla Wilkins patient did not mention cost was a problem for her. [facility] and would like to have it fill there.

## 2021-09-05 NOTE — Telephone Encounter (Signed)
I'm sorry it said BCP was denied. I can send it to [facility] will just be more expensive.

## 2021-09-05 NOTE — Telephone Encounter (Signed)
Kayla Wilkins patient)  Patient called BCP was denied patient said has been taking medication since Jan 2023, she stopped nuvaring due to problems with it. Patient is currently out of pills. She would like refill.

## 2021-10-07 ENCOUNTER — Ambulatory Visit: Payer: Self-pay | Admitting: Obstetrics & Gynecology

## 2021-10-07 DIAGNOSIS — Z0289 Encounter for other administrative examinations: Secondary | ICD-10-CM

## 2021-10-14 ENCOUNTER — Encounter: Payer: Self-pay | Admitting: Obstetrics & Gynecology

## 2021-10-14 ENCOUNTER — Ambulatory Visit: Payer: 59 | Admitting: Obstetrics & Gynecology

## 2021-10-14 VITALS — BP 114/76 | HR 85

## 2021-10-14 DIAGNOSIS — N9489 Other specified conditions associated with female genital organs and menstrual cycle: Secondary | ICD-10-CM | POA: Diagnosis not present

## 2021-10-14 DIAGNOSIS — N766 Ulceration of vulva: Secondary | ICD-10-CM | POA: Diagnosis not present

## 2021-10-14 DIAGNOSIS — R300 Dysuria: Secondary | ICD-10-CM

## 2021-10-14 DIAGNOSIS — A630 Anogenital (venereal) warts: Secondary | ICD-10-CM

## 2021-10-14 DIAGNOSIS — Z113 Encounter for screening for infections with a predominantly sexual mode of transmission: Secondary | ICD-10-CM

## 2021-10-14 LAB — URINALYSIS, COMPLETE W/RFL CULTURE
Bacteria, UA: NONE SEEN /HPF
Bilirubin Urine: NEGATIVE
Glucose, UA: NEGATIVE
Hgb urine dipstick: NEGATIVE
Hyaline Cast: NONE SEEN /LPF
Ketones, ur: NEGATIVE
Leukocyte Esterase: NEGATIVE
Nitrites, Initial: NEGATIVE
Protein, ur: NEGATIVE
RBC / HPF: NONE SEEN /HPF (ref 0–2)
Specific Gravity, Urine: 1.025 (ref 1.001–1.035)
WBC, UA: NONE SEEN /HPF (ref 0–5)
pH: 5.5 (ref 5.0–8.0)

## 2021-10-14 LAB — NO CULTURE INDICATED

## 2021-10-14 LAB — WET PREP FOR TRICH, YEAST, CLUE

## 2021-10-14 MED ORDER — FLUCONAZOLE 150 MG PO TABS: 150.0000 mg | ORAL_TABLET | ORAL | 0 refills | Status: AC

## 2021-10-14 MED ORDER — TINIDAZOLE 500 MG PO TABS: 1000.0000 mg | ORAL_TABLET | Freq: Two times a day (BID) | ORAL | 0 refills | Status: AC

## 2021-10-14 NOTE — Progress Notes (Signed)
    Kayla Wilkins [birth year] [id number]        31 y.o.  G0 Single  RP: Burning with urination/vulvar itching and right vulvar cut  HPI: LMP 10/08/21.  Started feeling vulvar irritation with burning at the Rt anterior vulva with the menses.  Looked and saw that her skin was cut where she feels a burn.  Burning after passing urine.  No pelvic pain.  No fever.  Felt that her skin had torn a little with her last sexual encounter a couple of months ago.  Used condoms.  Would like STI screen.  Last Pap test Negative 03/2021.     OB History  Gravida Para Term Preterm AB Living  0 0 0 0 0 0  SAB IAB Ectopic Multiple Live Births  0 0 0 0 0    Past medical history,surgical history, problem list, medications, allergies, family history and social history were all reviewed and documented in the EPIC chart.   Directed ROS with pertinent positives and negatives documented in the history of present illness/assessment and plan.  Exam:  Vitals:   10/14/21 1352  BP: 114/76  Pulse: 85  SpO2: 97%   General appearance:  Normal  Abdomen: Normal  Gynecologic exam: Vulva with condyloma/fissure in the middle at the Rt anterior vulva.  Speculum:  Cervix/Vagina normal.  Increased vaginal discharge.  Wet prep done.  Gono-Chlam done.  Wet Prep:  Clue cells present with odor.  Yeasts present. U/A completely negative   Assessment/Plan:  31 y.o. G0  1. Burning with urination LMP 10/08/21.  Started feeling vulvar irritation with burning at the Rt anterior vulva with the menses.  Looked and saw that her skin was cut where she feels a burn.  Burning after passing urine.  No pelvic pain.  No fever.  Felt that her skin had torn a little with her last sexual encounter a couple of months ago.  Used condoms.  Would like STI screen.  Last Pap test Negative 03/2021.   U/A completely negative, reassured. - Urinalysis,Complete w/RFL Culture  2. Vulvar burning Both Bacterial Vaginosis and Yeast Vaginitis confirmed  by Wet prep.  Decision to treat with Tinidazole.  Usage reviewed.  Will then treat with Fluconazole, usage reviewed.  Prescriptions sent to pharmacy.  Probably burning also at the fissured condyloma.  HSV done at that location. - WET PREP FOR [place], YEAST, CLUE  3. 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  SureSwab HSV done. - SureSwab HSV, Type 1/2 DNA, PCR  4. 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  F/U Colposcopy.  Will freeze the condyloma. - Colposcopy; Future  5. Screening examination for STD (sexually transmitted disease) - HIV antibody (with reflex) - RPR - Hepatitis B Surface AntiGEN - Hepatitis C Antibody -SureSwab HSV, type 1/2 DNA, PCR - Gono-Chlam  Other orders - fluconazole (DIFLUCAN) 150 MG tablet; Take 1 tablet (150 mg total) by mouth every other day for 3 doses. - tinidazole (TINDAMAX) 500 MG tablet; Take 2 tablets (1,000 mg total) by mouth 2 (two) times daily for 2 days.   Princess Bruins MD, 1:57 PM 10/14/2021

## 2021-10-15 LAB — RPR: RPR Ser Ql: NONREACTIVE

## 2021-10-15 LAB — C. TRACHOMATIS/N. GONORRHOEAE RNA
C. trachomatis RNA, TMA: NOT DETECTED
N. gonorrhoeae RNA, TMA: NOT DETECTED

## 2021-10-15 LAB — HEPATITIS B SURFACE ANTIGEN: Hepatitis B Surface Ag: NONREACTIVE

## 2021-10-15 LAB — HIV ANTIBODY (ROUTINE TESTING W REFLEX): HIV 1&2 Ab, 4th Generation: NONREACTIVE

## 2021-10-15 LAB — HEPATITIS C ANTIBODY: Hepatitis C Ab: NONREACTIVE

## 2021-10-16 LAB — SURESWAB HSV, TYPE 1/2 DNA, PCR
HSV 1 DNA: NOT DETECTED
HSV 2 DNA: NOT DETECTED

## 2021-10-19 ENCOUNTER — Encounter: Payer: Self-pay | Admitting: Obstetrics & Gynecology

## 2021-11-02 ENCOUNTER — Encounter: Payer: Self-pay | Admitting: Obstetrics & Gynecology

## 2021-11-03 ENCOUNTER — Ambulatory Visit: Payer: 59 | Admitting: Obstetrics & Gynecology

## 2022-02-04 ENCOUNTER — Other Ambulatory Visit: Payer: Self-pay | Admitting: Radiology

## 2022-02-04 DIAGNOSIS — N946 Dysmenorrhea, unspecified: Secondary | ICD-10-CM

## 2022-02-05 NOTE — Telephone Encounter (Signed)
Last AEX 03/20/2021--nothing scheduled for 2024. However, recall placed.   Last fill per pharmacy on 11/24/2021 for #84 tabs

## 2022-05-04 ENCOUNTER — Encounter: Payer: Self-pay | Admitting: *Deleted

## 2022-05-15 ENCOUNTER — Other Ambulatory Visit: Payer: Self-pay | Admitting: Nurse Practitioner

## 2022-05-15 DIAGNOSIS — N946 Dysmenorrhea, unspecified: Secondary | ICD-10-CM

## 2022-05-15 DIAGNOSIS — Z30015 Encounter for initial prescription of vaginal ring hormonal contraceptive: Secondary | ICD-10-CM

## 2022-05-15 DIAGNOSIS — G43829 Menstrual migraine, not intractable, without status migrainosus: Secondary | ICD-10-CM

## 2022-05-15 DIAGNOSIS — N920 Excessive and frequent menstruation with regular cycle: Secondary | ICD-10-CM

## 2022-05-18 NOTE — Telephone Encounter (Signed)
AEX 03/20/21 Needs to schedule visit.

## 2022-05-20 NOTE — Telephone Encounter (Signed)
Pt LVM in triage line stating that this was the second message that she had left and that her pharmacy reported they had requested refills last week and had not heard from our office. Pt states she is now 2 days w/o BCPs.   Documentation notes refill request received on 05/15/22 and was refused due to pt being overdue for AEX and request was for nuvaring and not POPs.   Last AEX 03/20/2021--recall sent per EMR. Nothing scheduled as of yet.   Spoke w/ pt and she is now scheduled for her AEX on 06/09/22.   Rx pend.

## 2022-05-21 MED ORDER — NORETHINDRONE 0.35 MG PO TABS: 1.0000 | ORAL_TABLET | Freq: Every day | ORAL | 0 refills | Status: DC

## 2022-05-21 NOTE — Addendum Note (Signed)
Addended by: Keenan Bachelor on: 05/21/2022 12:18 PM   Modules accepted: Orders

## 2022-06-09 ENCOUNTER — Ambulatory Visit (INDEPENDENT_AMBULATORY_CARE_PROVIDER_SITE_OTHER): Payer: 59 | Admitting: Obstetrics & Gynecology

## 2022-06-09 ENCOUNTER — Encounter: Payer: Self-pay | Admitting: Obstetrics & Gynecology

## 2022-06-09 VITALS — BP 118/80 | HR 73 | Ht 67.25 in | Wt 315.0 lb

## 2022-06-09 DIAGNOSIS — Z01419 Encounter for gynecological examination (general) (routine) without abnormal findings: Secondary | ICD-10-CM

## 2022-06-09 DIAGNOSIS — Z3041 Encounter for surveillance of contraceptive pills: Secondary | ICD-10-CM | POA: Diagnosis not present

## 2022-06-09 DIAGNOSIS — N912 Amenorrhea, unspecified: Secondary | ICD-10-CM

## 2022-06-09 LAB — PREGNANCY, URINE: Preg Test, Ur: NEGATIVE

## 2022-06-09 NOTE — Progress Notes (Signed)
Kayla Wilkins [birth year] [id number]   History:    32 y.o. G0   RP:  Established patient presenting for annual gyn exam    HPI: Well on the Progestin only BCPs. Missed a few pills recently.  UPT Neg today. Still experiencing dysmenorrhea for which she uses Ibuprofen.  No BTB.  No pelvic pain. Bisexual. Pap Neg 03/2021. No h/o abnormal Pap. Repeat Pap at 3 years. Breasts normal.  Urine/BMs normal.  BMI increased to 48.97.  [facility] with Fam MD.   Past medical history,surgical history, family history and social history were all reviewed and documented in the EPIC chart.  Gynecologic History Patient's last menstrual period was 05/05/2022.  Obstetric History OB History  Gravida Para Term Preterm AB Living  0 0 0 0 0 0  SAB IAB Ectopic Multiple Live Births  0 0 0 0 0     ROS: A ROS was performed and pertinent positives and negatives are included in the history. GENERAL: No fevers or chills. HEENT: No change in vision, no earache, sore throat or sinus congestion. NECK: No pain or stiffness. CARDIOVASCULAR: No chest pain or pressure. No palpitations. PULMONARY: No shortness of breath, cough or wheeze. GASTROINTESTINAL: No abdominal pain, nausea, vomiting or diarrhea, melena or bright red blood per rectum. GENITOURINARY: No urinary frequency, urgency, hesitancy or dysuria. MUSCULOSKELETAL: No joint or muscle pain, no back pain, no recent trauma. DERMATOLOGIC: No rash, no itching, no lesions. ENDOCRINE: No polyuria, polydipsia, no heat or cold intolerance. No recent change in weight. HEMATOLOGICAL: No anemia or easy bruising or bleeding. NEUROLOGIC: No headache, seizures, numbness, tingling or weakness. PSYCHIATRIC: No depression, no loss of interest in normal activity or change in sleep pattern.     Exam:   BP 118/80   Pulse 73   Ht 5' 7.25" (1.708 m)   Wt (!) 315 lb (142.9 kg)   LMP 05/05/2022 Comment: sexually active- pop's, ran out for 3 days  SpO2 97%   BMI 48.97 kg/m   Body  mass index is 48.97 kg/m.  General appearance : Well developed well nourished female. No acute distress HEENT: Eyes: no retinal hemorrhage or exudates,  Neck supple, trachea midline, no carotid bruits, no thyroidmegaly Lungs: Clear to auscultation, no rhonchi or wheezes, or rib retractions  Heart: Regular rate and rhythm, no murmurs or gallops Breast:Examined in sitting and supine position were symmetrical in appearance, no palpable masses or tenderness,  no skin retraction, no nipple inversion, no nipple discharge, no skin discoloration, no axillary or supraclavicular lymphadenopathy Abdomen: no palpable masses or tenderness, no rebound or guarding Extremities: no edema or skin discoloration or tenderness  Pelvic: Vulva: Normal             Vagina: No gross lesions or discharge  Cervix: No gross lesions or discharge  Uterus  AV, normal size, shape and consistency, non-tender and mobile  Adnexa  Without masses or tenderness  Anus: Normal  UPT Neg   Assessment/Plan:  32 y.o. female for annual exam   1. Well female exam with routine gynecological exam Well on the Progestin only BCPs. Missed a few pills recently.  UPT Neg today. Still experiencing dysmenorrhea for which she uses Ibuprofen.  No BTB.  No pelvic pain. Bisexual. Pap Neg 03/2021. No h/o abnormal Pap. Repeat Pap at 3 years. Breasts normal.  Urine/BMs normal.  BMI increased to 48.97.  Health Labs with Fam MD.  2. Encounter for surveillance of contraceptive pills Well on the Progestin only BCPs. Missed  a few pills recently.  UPT Neg today. Still experiencing dysmenorrhea for which she uses Ibuprofen.  No BTB.  No pelvic pain. Recommend condoms for contraception with the Progestin pill and for STI prevention.  No CI to continue on the Progestin pill.  Prescription sent to pharmacy.  3. Missed BCPs UPT Neg.  Recommend using condoms as well. - Pregnancy, urine  Other orders - almotriptan (AXERT) 12.5 MG tablet; Take by mouth. -  brexpiprazole (REXULTI) 1 MG TABS tablet; Take by mouth. - diazepam (VALIUM) 5 MG tablet; Take by mouth. - loratadine (CLARITIN) 10 MG tablet; Take by mouth. - topiramate (TOPAMAX) 25 MG tablet; Take 1 tablet by mouth daily.   [name] MD, 12:20 PM

## 2022-06-22 NOTE — Telephone Encounter (Signed)
Mychart msg returned unread. However, pt seen for AEX on 06/09/2022. Will close encounter.

## 2022-07-30 ENCOUNTER — Other Ambulatory Visit: Payer: Self-pay | Admitting: Obstetrics & Gynecology

## 2022-07-30 DIAGNOSIS — N946 Dysmenorrhea, unspecified: Secondary | ICD-10-CM

## 2022-07-30 NOTE — Telephone Encounter (Signed)
Medication refill request: Micronor  Last AEX:  06/09/22 Next AEX: not scheduled  Last MMG (if hormonal medication request): n/a Refill authorized: #84 with 2 rf pended for today

## 2023-01-22 ENCOUNTER — Other Ambulatory Visit: Payer: Self-pay | Admitting: Obstetrics & Gynecology

## 2023-01-22 DIAGNOSIS — N946 Dysmenorrhea, unspecified: Secondary | ICD-10-CM

## 2023-01-25 NOTE — Telephone Encounter (Signed)
 Medication refill request: micronor  Last AEX: 06/09/22 Next AEX: not scheduled  Last MMG (if hormonal medication request): n/a Refill authorized: patient was given #84 with 2 RF in 07/30/2022

## 2023-08-11 NOTE — Progress Notes (Signed)
 Kayla Wilkins  Kayla Wilkins  [id number]  1990-08-29 33 y.o.   Chief Complaint:  Chief Complaint  Patient presents with  . Thoracic Spine - Pain  . Lumbar Spine - Pain  . Right Leg - Pain     History of Present Illness:  08/11/2023 History of Present Illness The patient, a 33 year old female, presents for follow-up regarding her MRI results.  She was last evaluated by the neurosurgery department and has a forthcoming appointment with Dr. Fargin scheduled for 10/19/2023. The patient reports experiencing muscle spasms, which have not shown improvement despite treatment with gabapentin. Prolonged ambulation results in paresthesia and increased muscular weakness, with a persistent lack of sensation in the groin region.  She notes that excessive physical activity exacerbates her pain, and she is unable to walk or sit for extended durations without subsequent swelling, arthralgia, instability in the knees, and flu-like symptoms the following day. She expresses concern regarding the possibility of a hairline fracture in her spine contributing to her symptoms and is interested in determining if there are any degenerative changes in her thoracic spine that may be responsible for her pain.  SOCIAL HISTORY Exercise: Reports that working out causes pain, leading to weight gain and difficulty walking.   07/07/2023 History of Present Illness The patient, a 33 year old female, presents with complaints of mid to low back pain.  She reports experiencing lumbar radiculopathy, with pain radiating to her right leg, accompanied by muscle spasms. Additionally, she experiences paresthesia and weakness in both her upper and lower extremities, which occasionally impairs her ability to perform activities of daily living, such as washing her hair or ambulating. The patient describes her back pain as chronic, with a notable exacerbation since January 2025, which she refers to as a Chiari flare. She recalls an incident involving a  tearing sensation in her back while straightening up, followed by weakness, numbness, tingling, and radicular pain in her arms. These symptoms persisted for approximately 1 to 2 weeks. On Jun 19, 2023, she experienced a similar tearing sensation while lifting her right leg over her left while getting out of bed, resulting in knee instability upon standing. She also reports awakening with a severe migraine (rated 10/10), experiencing inflammation from the occipital region down her spine, and noting edema. She expresses concern about potential damage to her thecal sac or intervertebral discs. The patient has not undergone physical therapy for her back pain. 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 She does not recall any head trauma but reports episodes of amnesia associated with syncope. She has an upcoming consultation with Dr. Mavis at [facility].  The patient continues to follow up with neurosurgery for her Chiari malformation. She was evaluated by Dr. Landy at [facility], who recommended further assessment by a neurologist prior to considering surgical intervention. Currently, she is not under the care of either neurology or neurosurgery but is actively seeking a new neurologist and has scheduled a consultation with Dr. Mavis at [facility].  Medical History[1]  Surgical History[2]     Social Drivers of Health with Concerns   Food Insecurity: Food Insecurity Present (05/31/2023)   Received from [facility]   Food vital sign   . Within the past 12 months, you worried that your food would run out before you got money to buy more: Sometimes true   . Within the past 12 months, the  food you bought just didn't last and you didn't have money to get more: Sometimes true  Utilities: At Risk (05/31/2023)   Received from [facility]   [facility] Utilities   . Threatened with loss of utilities: Yes  Alcohol Screening: Unknown (07/15/2023)   AUDIT-C   . Frequency of Alcohol Consumption: 2-3 times a week   . Average Number of Drinks: Not on file   . Frequency of Binge Drinking: Not on file  Tobacco Use: High Risk (08/11/2023)   Patient History   . Smoking Tobacco Use: Former   . Smokeless Tobacco Use: Current   . Passive Exposure: Never  Depression: At risk (04/09/2023)   PHQ-2   . PHQ-2 Score: 6     Review of Systems:  Reviewed patient questionnaire including history of present illness, prior treatment, past medical history, family/social history, and review of systems    Current Medications[3]    Physical Exam: Vitals Vitals:   08/11/23 0840  BP: 134/81  Pulse: 82  Temp: 97.9 F (36.6 C)    CONSTITUTIONAL: Appears well developed, no acute distress  HEAD: Normocephalic atraumatic  EYES: EOMI.  NECK: Supple, no tracheal deviation  CHEST/RESPIRATORY: Symmetric expansion of the chest, no intercostal retraction, no respiratory distress  CARDIOVASCULAR: Pulses palpable at the wrist, capillary refill <2secs, no edema  NEUROLOGIC EXAM: Alert oriented x 3 with normal coordination  PSYCHIATRIC: Normal mood, affect, thought content, & judgement for  age  SKIN: No erythema, no discoloration, no open draining wounds, no rash     Radiographic Studies: Results   Imaging MRI of the cervical spine does not demonstrate foraminal or central stenosis  - MRI of the thoracic spine: Degenerative changes in the thoracic spine, degenerative endplate changes of the bones, and some kyphosing. No neural impingement observed.  - MRI of the lower lumbar spine: Degenerative changes and facet arthropathy without substantial neural impingement foraminally are of the central canal     I have independently reviewed the patient following labs, allergies and medications: Labs Lab Results  Component Value Date/Time    EGFR >90 07/11/2023 04:10 PM    Lab Results  Component Value Date/Time   HGBA1C 5.2 02/23/2023 11:03 AM    Lab Results  Component Value Date   WBC 7.00 07/11/2023   HGB 13.3 07/11/2023   HCT 38.7 07/11/2023   PLT 307 07/11/2023   Lab Results  Component Value Date   NA 138 07/11/2023   K 3.9 07/11/2023   CL 107 07/11/2023   CO2 22 07/11/2023   BUN 15 07/11/2023   CREATININE 0.80 07/11/2023   CALCIUM 9.0 07/11/2023   MG 2.0 07/03/2023   Lab Results  Component Value Date   BILITOT 0.4 07/11/2023   PROT 6.6 07/11/2023   ALBUMIN 4.3 07/11/2023   ALT 30 07/11/2023   AST 25 07/11/2023   No results found for: LABPROT, INR, APTT Lab Results  Component Value Date/Time   CHOL 120 02/23/2023 11:03 AM   HDL 33 (L) 02/23/2023 11:03 AM   TRIG 118 02/23/2023 11:03 AM    Allergies Patient has no known allergies.  Assessment:  Kayla Wilkins  was seen today for pain, pain and pain.  Diagnoses and all orders for this visit:  Chronic primary low back pain -     Ambulatory referral to Pain Medicine; Future      During this visit we had a conversation regarding their diagnosis, etiology, natural history, and treatment options, both nonsurgical and surgical.  Assessment & Plan 1. Osteoarthritis: Stable. MRI results reviewed.  No surgical recommendations from orthopedic spine surgery - Referral to pain management for evaluation and treatment of back pain - Consultation with general orthopedics for lower extremity symptoms to determine cause of numbness and weakness in legs - Discuss lifestyle modifications such as maintaining an active lifestyle and weight management   Follow-up Orthopedic spine surgery as needed    Orders Placed This Encounter  Procedures  . Ambulatory referral to Pain Medicine    Standing Status:   Future    Expiration Date:   09/10/2024    Referral Priority:   Routine    Referral Type:   PROVIDER REFERRAL    Requested Specialty:   Pain Medicine     Number of Visits Requested:   1      NOTE ~ This chart has been made utilizing [employer]. Review for errors has been completed but may not always be caught. Please excuse any errors that have been missed.     Electronically signed by:  Vernell Calton Bohr, NP, 08/11/2023 8:44 AM       [1] Past Medical History: Diagnosis Date  . Hypertension   [2] Past Surgical History: Procedure Laterality Date  . CHOLECYSTECTOMY  2009   Procedure: CHOLECYSTECTOMY  . GASTRECTOMY  2016   Procedure: GASTRECTOMY; vertical sleeve gastrectomy  [3]  Current Outpatient Medications:  .  almotriptan 12.5 mg tab, Take 1 tablet (12.5 mg total) by mouth as needed (headache)., Disp: 12 tablet, Rfl: 11 .  aspirin-acetaminophen -caffeine (Excedrin Extra Strength) 250-250-65 mg tab per tablet, Take 2 tablets by mouth every 6 (six) hours as needed., Disp: , Rfl:  .  b complex vitamins cap capsule, Take 1 tablet by mouth daily., Disp: , Rfl:  .  cholecalciferol, vitD3,/vit K2 (VITAMIN D3-VITAMIN K2 ORAL), Take 2 tablets by mouth daily., Disp: , Rfl:  .  dextroamphetamine-amphetamine (ADDERALL) 15 mg tablet, Take 15 mg by mouth 2 (two) times a day., Disp: , Rfl:  .  diazePAM (VALIUM) 5 mg tablet, Take 5 mg by mouth in the morning and 5 mg at noon and 5 mg before bedtime., Disp: , Rfl:  .  gabapentin (NEURONTIN) 300 mg capsule, Take 1 capsule (300 mg total) by mouth 3 (three) times a day., Disp: 90 capsule, Rfl: 1 .  metoprolol succinate (TOPROL XL) 100 mg 24 hr tablet, Take 1 tablet (100 mg total) by mouth daily., Disp: 90 tablet, Rfl: 1 .  SAW PALMETTO ORAL, Take 2,400 mg by mouth., Disp: , Rfl:  .  valsartan (DIOVAN) 80 mg tablet, TAKE ONE TABLET BY MOUTH DAILY, Disp: 30 tablet, Rfl: 0 .  venlafaxine (EFFEXOR XR) 75 mg 24 hr capsule, Take 75 mg by mouth daily., Disp: , Rfl:  .  MAGNESIUM GLYCINATE ORAL, Take 240 mg by mouth., Disp: , Rfl:

## 2023-09-08 NOTE — Progress Notes (Signed)
 Physical Therapy Missed Visit Note  Payor: [employer] / Plan: [employer] PPO EPO CHOICE [employer] / Product Type: PPO /     The patient did not attend therapy appointment.  Reason:  Patient canceled via MyChart reporting Chiari malformation migraine. Patient stated they are getting worse and more frequent. Patient apologized for last minute cancel and stated she would reschedule ASAP.   Total Missed Visits: Missed Visit Count: 1  Patient Contact:  No Contact with patient  Future Appointments:  Patient does not have additional appointments.  Action:  Initial evaluation, patient responsible for calling to reschedule.

## 2023-10-01 ENCOUNTER — Ambulatory Visit
Admission: EM | Admit: 2023-10-01 | Discharge: 2023-10-01 | Disposition: A | Discharge status: Home / Self Care | Attending: Nurse Practitioner | Admitting: Nurse Practitioner

## 2023-10-01 ENCOUNTER — Encounter: Payer: Self-pay | Admitting: Emergency Medicine

## 2023-10-01 DIAGNOSIS — M2550 Pain in unspecified joint: Secondary | ICD-10-CM

## 2023-10-01 MED ORDER — KETOROLAC TROMETHAMINE 60 MG/2ML IM SOLN
60.0000 mg | Freq: Once | INTRAMUSCULAR | Status: AC
  Administered 2023-10-01: 60 mg via INTRAMUSCULAR

## 2023-10-01 NOTE — ED Provider Notes (Signed)
 [facility]    CSN: [id number] Arrival date & time: 10/01/23  1735      History   Chief Complaint Chief Complaint  Patient presents with   Joint Pain    HPI Kayla Wilkins is a 33 y.o. female.   Discussed the use of AI scribe software for clinical note transcription with the patient, who gave verbal consent to proceed.   The patient presents with concerns of rebound symptoms after completing a course of dexamethasone  prescribed by her neurologist for chronic migraines and headaches related to Chiari malformation. She has a history of gastric bypass surgery.  The patient reports that she was initially prescribed an 8-day course of dexamethasone  at the end of last month for her migraines.  During the treatment, she experienced significant improvement in her overall well-being, noting that her whole body felt better and less congested. However, approximately 1.5 to 2 days after completing the course, she developed severe pain throughout her body, describing it as feeling like she was boiling alive with pain in every joint. The pain was so severe that she had difficulty walking and required assistance to navigate stairs safely.  Due to the severity of her symptoms, the patient contacted her doctor and was prescribed a much slower tapered dose of dexamethasone , starting on 09/27/23. She is currently on her 4th day of this new regimen. While the pain has somewhat improved, she woke up this morning with pain in her right ankle and knee, though not as severe as before.   The patient reports a long history of pain spanning years or even decades. She suspects an underlying autoimmune or inflammatory condition that has not been diagnosed, noting that she frequently gets sick. She expresses concern about the dramatic improvement she experienced while on the steroid and the subsequent severe withdrawal symptoms, suggesting this might indicate an undiagnosed condition.   The following  sections of the patient's history were reviewed and updated as appropriate: allergies, current medications, past family history, past medical history, past social history, past surgical history, and problem list.        Past Medical History:  Diagnosis Date   ADD (attention deficit disorder)    Anxiety    Bilateral ovarian cysts    Chiari malformation type I (HCC) 2017   last head CT in epic 04-19-2019   Depression    Endometrial polyp    Headache    History of COVID-19 01/2020   per pt mild to moderate symptoms that resolved   History of palpitations    had cardiology work-up done by dr b. 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/06/2019   Arthralgia 04/25/2019   Myalgia 04/25/2019   Palpitations 04/25/2019   Paresthesias 04/25/2019   Pelvic pain 11/20/2018   Migraine headache 11/20/2018   Painful defecation 11/20/2018   Libido, decreased 11/03/2018   Amenorrhea 11/03/2018   Chiari malformation type I (HCC) 09/16/2018   Dry skin 08/03/2018   Spine pain, multilevel 07/04/2018   Psoriasis and similar disorder 07/04/2018   Muscle spasms of both lower extremities 06/03/2018   Loss of feeling or sensation 06/03/2018   Unsteady gait when walking 06/03/2018   Arrhythmia 04/03/2018   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   Peripheral neuropathy 04/03/2018   Urinary incontinence 04/03/2018   Chronic hip pain, bilateral 08/16/2017   Bilateral leg weakness 07/26/2017   History of gastric bypass 07/26/2017   Patellar tendinitis of both knees 07/26/2017   Patellofemoral pain syndrome of both knees 07/26/2017   MDD (major depressive disorder), recurrent, in full remission (HCC)  12/31/2015   Vaginal discharge 08/21/2015   Urinary urgency 08/21/2015   Irregular periods 08/21/2015   Dyspareunia in female 08/21/2015   Hair loss 08/21/2015   Irritable 08/21/2015   Degenerative disc disease, lumbar 07/29/2015   Schmorl's nodes of thoracic region 07/29/2015   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   Chronic bilateral low back pain without sciatica 06/26/2014   Chronic pain of both knees 06/26/2014   Elevated BP 02/15/2013   Severe obesity (BMI >= 40) (HCC) 02/15/2013   Malpositioned IUD 02/08/2013   H/O menorrhagia 02/08/2013   Other specified complication of genitourinary prosthetic devices, implants and grafts, initial encounter (HCC) 02/08/2013   ADHD 01/20/2012   Generalized anxiety disorder 01/20/2012    Past Surgical History:  Procedure Laterality Date   CHOLECYSTECTOMY, LAPAROSCOPIC  2009   DILATATION & CURETTAGE/HYSTEROSCOPY WITH MYOSURE N/A 09/04/2020   Procedure: DILATATION & CURETTAGE/HYSTEROSCOPY WITH MYOSURE;  Surgeon: Lavoie, Marie-Lyne, MD;  Location: [facility];  Service: Gynecology;  Laterality: N/A;   LAPAROSCOPIC GASTRIC SLEEVE RESECTION  01/14/2015   @ [facility]    OB History     Gravida  0   Para  0   Term  0   Preterm  0   AB  0   Living  0      SAB  0   IAB  0   Ectopic  0   Multiple  0   Live Births  0            Home Medications    Prior to Admission medications   Medication Sig Start Date End Date Taking? Authorizing Provider  acetaminophen  (TYLENOL ) 500 MG tablet Take 500 mg by mouth every 6 (six) hours as needed.    [provider]  almotriptan (AXERT) 12.5 MG tablet Take by mouth. 05/27/22   [provider]  amphetamine-dextroamphetamine (ADDERALL) 15 MG tablet Take 1 tablet by mouth 2 (two) times daily. 09/05/21   [provider]  aspirin-acetaminophen -caffeine (EXCEDRIN MIGRAINE) 250-250-65 MG tablet Take by mouth every 6 (six) hours as needed for  headache.    [provider]  brexpiprazole (REXULTI) 1 MG TABS tablet Take by mouth.    [provider]  desvenlafaxine (PRISTIQ) 50 MG 24 hr tablet Take 50 mg by mouth at bedtime.    [provider]  diazepam (VALIUM) 5 MG tablet Take by mouth. 05/14/22   [provider]  Ibuprofen 200 MG CAPS Take by mouth as needed.    [provider]  loratadine (CLARITIN) 10 MG tablet Take by mouth.    [provider]  metoprolol succinate (TOPROL-XL) 50 MG 24 hr tablet Take 50 mg by mouth daily. 03/19/21   [provider]  norethindrone  (MICRONOR ) 0.35 MG tablet TAKE ONE TABLET BY MOUTH DAILY 01/25/23   Prentiss Annabella LABOR, NP    Family History Family History  Problem Relation Age of Onset   Varicose Veins Mother    Hypertension Mother    Depression Mother    Anxiety disorder Mother    Diabetes Paternal Aunt    Arthritis Maternal Grandmother    Alzheimer's disease Maternal  Grandmother    Stroke Maternal Grandmother    Cancer Maternal Grandfather        lung   Schizophrenia Maternal Grandfather    Schizophrenia Father    Multiple sclerosis Maternal Aunt    Colon cancer Maternal Aunt     Social History Social History   Tobacco Use   Smoking status: Former    Current packs/day: 0.00    Types: Cigarettes    Start date: 2005    Quit date: 2009    Years since quitting: 16.7   Smokeless tobacco: Current   Tobacco comments:    vape  Vaping Use   Vaping status: Every Day   Devices: [place]  Substance Use Topics   Alcohol use: Yes    Alcohol/week: 3.0 - 4.0 standard drinks of alcohol    Types: 3 - 4 Standard drinks or equivalent per week   Drug use: Not Currently     Allergies   Patient has no known allergies.   Review of Systems Review of Systems  Constitutional:  Negative for fever.  Musculoskeletal:  Positive for arthralgias. Negative for joint swelling.  All other systems reviewed and are negative.    Physical  Exam Triage Vital Signs ED Triage Vitals  Encounter Vitals Group     BP 10/01/23 1759 (!) 129/97     Girls Systolic BP Percentile --      Girls Diastolic BP Percentile --      Boys Systolic BP Percentile --      Boys Diastolic BP Percentile --      Pulse Rate 10/01/23 1759 75     Resp 10/01/23 1759 17     Temp 10/01/23 1759 (!) 97.5 F (36.4 C)     Temp src --      SpO2 10/01/23 1759 98 %     Weight --      Height --      Head Circumference --      Peak Flow --      Pain Score 10/01/23 1757 9     Pain Loc --      Pain Education --      Exclude from Growth Chart --    No data found.  Updated Vital Signs BP (!) 129/97 (BP Location: Right Arm)   Pulse 75   Temp (!) 97.5 F (36.4 C)   Resp 17   SpO2 98%   Visual Acuity Right Eye Distance:   Left Eye Distance:   Bilateral Distance:    Right Eye Near:   Left Eye Near:    Bilateral Near:     Physical Exam Vitals reviewed.  Constitutional:      General: She is awake. She is not in acute distress.    Appearance: Normal appearance. She is well-developed. She is morbidly obese. She is not ill-appearing, toxic-appearing or diaphoretic.  HENT:     Head: Normocephalic.     Right Ear: Hearing normal.     Left Ear: Hearing normal.     Nose: Nose normal.     Mouth/Throat:     Mouth: Mucous membranes are moist.  Eyes:     General: Vision grossly intact.     Conjunctiva/sclera: Conjunctivae normal.  Cardiovascular:     Rate and Rhythm: Normal rate and regular rhythm.     Heart sounds: Normal heart sounds.  Pulmonary:     Effort: Pulmonary effort is normal.     Breath sounds: Normal breath sounds and air entry.  Musculoskeletal:  General: No swelling or deformity. Normal range of motion.     Cervical back: Full passive range of motion without pain, normal range of motion and neck supple.  Skin:    General: Skin is warm and dry.  Neurological:     General: No focal deficit present.     Mental Status: She is  alert and oriented to person, place, and time.     Sensory: No sensory deficit.     Motor: No weakness.     Coordination: Coordination normal.     Gait: Gait normal.  Psychiatric:        Speech: Speech normal.        Behavior: Behavior is cooperative.      UC Treatments / Results  Labs (all labs ordered are listed, but only abnormal results are displayed) Labs Reviewed - No data to display  EKG   Radiology No results found.  Procedures Procedures (including critical care time)  Medications Ordered in UC Medications  ketorolac  (TORADOL ) injection 60 mg (has no administration in time range)    Initial Impression / Assessment and Plan / UC Course  I have reviewed the triage vital signs and the nursing notes.  Pertinent labs & imaging results that were available during my care of the patient were reviewed by me and considered in my medical decision making (see chart for details).     The patient presents with diffuse joint pain and inflammation following completion of an 8-day course of dexamethasone  prescribed for chronic migraines and Chiari malformation-related headaches. Symptoms began 1-2 days after discontinuation, were severe enough to impair mobility, and have shown partial improvement after restarting a slower taper four days ago. Ongoing pain is most pronounced in the right ankle and knee.  No acute deformity or focal deficit noted.  This presentation is consistent with steroid withdrawal-related symptoms, which aligns with the neurologist's recent assessment. Patient received an intramuscular Toradol  injection in clinic for acute pain control and was advised to continue the current dexamethasone  taper as prescribed by neurology. NSAIDs such as ibuprofen or naproxen may be used as needed for pain, but not both together, with caution given prior gastric bypass. Patient was instructed to follow up with neurology on Monday as scheduled and with her PCP on the 17th for ongoing  management. She was counseled to seek emergency care if weakness or mobility issues worsen or if new concerning symptoms develop.  Today's evaluation has revealed no signs of a dangerous process. Discussed diagnosis with patient and/or guardian. Patient and/or guardian aware of their diagnosis, possible red flag symptoms to watch out for and need for close follow up. Patient and/or guardian understands verbal and written discharge instructions. Patient and/or guardian comfortable with plan and disposition.  Patient and/or guardian has a clear mental status at this time, good insight into illness (after discussion and teaching) and has clear judgment to make decisions regarding their care  Documentation was completed with the aid of voice recognition software. Transcription may contain typographical errors.  Final Clinical Impressions(s) / UC Diagnoses   Final diagnoses:  Polyarthralgia     Discharge Instructions      You were seen today for joint pain and inflammation that began after stopping a course of dexamethasone  prescribed by your neurologist.. These symptoms are most consistent with steroid withdrawal. You have restarted a slower steroid taper as directed by your neurologist, and this should help your body adjust more gradually. You were given an intramuscular Toradol  injection today for pain  relief. For ongoing symptoms, you may take either ibuprofen or naproxen as needed for pain, but do not take both together. Because of your history of gastric bypass, take NSAIDs with food and use them cautiously to reduce stomach irritation. You may also use Tylenol  if additional pain control is needed. Continue to rest, stay well hydrated, and use ice or heat as tolerated to help relieve joint discomfort, especially in the ankle and knee. Follow up with your neurologist on Monday and with your primary care provider for ongoing management. Seek emergency care immediately if your weakness or mobility  worsens, if you develop sudden severe pain, difficulty walking, chest pain, shortness of breath, or any other new concerning symptoms.      ED Prescriptions   None    PDMP not reviewed this encounter.   Iola Lukes, [place] 10/01/23 1926

## 2023-10-01 NOTE — ED Triage Notes (Signed)
 Pt states she is on dexamethasone  aug28-sept5 after stopping that she developed severe joint pain and her doctor put her  on steroid taper which she is now 4 days into taper. Presents today due to still having severe joint pain.

## 2023-10-01 NOTE — Discharge Instructions (Addendum)
 You were seen today for joint pain and inflammation that began after stopping a course of dexamethasone  prescribed by your neurologist.. These symptoms are most consistent with steroid withdrawal. You have restarted a slower steroid taper as directed by your neurologist, and this should help your body adjust more gradually. You were given an intramuscular Toradol  injection today for pain relief. For ongoing symptoms, you may take either ibuprofen or naproxen as needed for pain, but do not take both together. Because of your history of gastric bypass, take NSAIDs with food and use them cautiously to reduce stomach irritation. You may also use Tylenol  if additional pain control is needed. Continue to rest, stay well hydrated, and use ice or heat as tolerated to help relieve joint discomfort, especially in the ankle and knee. Follow up with your neurologist on Monday and with your primary care provider for ongoing management. Seek emergency care immediately if your weakness or mobility worsens, if you develop sudden severe pain, difficulty walking, chest pain, shortness of breath, or any other new concerning symptoms.

## 2023-12-22 ENCOUNTER — Ambulatory Visit: Admitting: Neurology

## 2023-12-23 ENCOUNTER — Other Ambulatory Visit: Payer: Self-pay

## 2023-12-23 ENCOUNTER — Observation Stay (HOSPITAL_BASED_OUTPATIENT_CLINIC_OR_DEPARTMENT_OTHER)
Admission: EM | Admit: 2023-12-23 | Discharge: 2023-12-25 | DRG: 202 | Disposition: A | Payer: Self-pay | Attending: Internal Medicine | Admitting: Internal Medicine

## 2023-12-23 ENCOUNTER — Emergency Department (HOSPITAL_BASED_OUTPATIENT_CLINIC_OR_DEPARTMENT_OTHER): Payer: Self-pay | Admitting: Radiology

## 2023-12-23 DIAGNOSIS — F411 Generalized anxiety disorder: Secondary | ICD-10-CM | POA: Diagnosis present

## 2023-12-23 DIAGNOSIS — R0602 Shortness of breath: Secondary | ICD-10-CM

## 2023-12-23 DIAGNOSIS — J40 Bronchitis, not specified as acute or chronic: Principal | ICD-10-CM

## 2023-12-23 DIAGNOSIS — J209 Acute bronchitis, unspecified: Secondary | ICD-10-CM | POA: Diagnosis present

## 2023-12-23 LAB — CBC WITH DIFFERENTIAL/PLATELET
Abs Immature Granulocytes: 0.03 K/uL (ref 0.00–0.07)
Basophils Absolute: 0.1 K/uL (ref 0.0–0.1)
Basophils Relative: 1 %
Eosinophils Absolute: 1 K/uL — ABNORMAL HIGH (ref 0.0–0.5)
Eosinophils Relative: 8 %
HCT: 41.7 % (ref 36.0–46.0)
Hemoglobin: 14.5 g/dL (ref 12.0–15.0)
Immature Granulocytes: 0 %
Lymphocytes Relative: 29 %
Lymphs Abs: 3.6 K/uL (ref 0.7–4.0)
MCH: 33.2 pg (ref 26.0–34.0)
MCHC: 34.8 g/dL (ref 30.0–36.0)
MCV: 95.4 fL (ref 80.0–100.0)
Monocytes Absolute: 0.8 K/uL (ref 0.1–1.0)
Monocytes Relative: 6 %
Neutro Abs: 7.1 K/uL (ref 1.7–7.7)
Neutrophils Relative %: 56 %
Platelets: 409 K/uL — ABNORMAL HIGH (ref 150–400)
RBC: 4.37 MIL/uL (ref 3.87–5.11)
RDW: 12.1 % (ref 11.5–15.5)
WBC: 12.6 K/uL — ABNORMAL HIGH (ref 4.0–10.5)
nRBC: 0 % (ref 0.0–0.2)

## 2023-12-23 LAB — RESP PANEL BY RT-PCR (RSV, FLU A&B, COVID)  RVPGX2
Influenza A by PCR: NEGATIVE
Influenza B by PCR: NEGATIVE
Resp Syncytial Virus by PCR: NEGATIVE
SARS Coronavirus 2 by RT PCR: NEGATIVE

## 2023-12-23 LAB — PRO BRAIN NATRIURETIC PEPTIDE: Pro Brain Natriuretic Peptide: 166 pg/mL (ref <300.0)

## 2023-12-23 LAB — BASIC METABOLIC PANEL WITH GFR
Anion gap: 15 (ref 5–15)
BUN: 6 mg/dL (ref 6–20)
CO2: 22 mmol/L (ref 22–32)
Calcium: 10 mg/dL (ref 8.9–10.3)
Chloride: 103 mmol/L (ref 98–111)
Creatinine, Ser: 0.72 mg/dL (ref 0.44–1.00)
GFR, Estimated: >60 mL/min (ref >60)
Glucose, Bld: 107 mg/dL — ABNORMAL HIGH (ref 70–99)
Potassium: 3.5 mmol/L (ref 3.5–5.1)
Sodium: 140 mmol/L (ref 135–145)

## 2023-12-23 LAB — D-DIMER, QUANTITATIVE: D-Dimer, Quant: 0.43 ug{FEU}/mL (ref 0.00–0.50)

## 2023-12-23 MED ORDER — DIAZEPAM 5 MG PO TABS
5.0000 mg | ORAL_TABLET | Freq: Every day | ORAL | Status: DC | PRN
  Administered 2023-12-23 – 2023-12-24 (×2): 5 mg via ORAL
  Filled 2023-12-23 (×2): qty 1

## 2023-12-23 MED ORDER — ONDANSETRON HCL 4 MG/2ML IJ SOLN: 4.0000 mg | Freq: Four times a day (QID) | INTRAMUSCULAR | Status: DC | PRN

## 2023-12-23 MED ORDER — ARFORMOTEROL TARTRATE 15 MCG/2ML IN NEBU
15.0000 ug | INHALATION_SOLUTION | Freq: Two times a day (BID) | RESPIRATORY_TRACT | Status: DC
  Administered 2023-12-23 – 2023-12-25 (×5): 15 ug via RESPIRATORY_TRACT
  Filled 2023-12-23 (×5): qty 2

## 2023-12-23 MED ORDER — ALBUTEROL SULFATE (2.5 MG/3ML) 0.083% IN NEBU
10.0000 mg/h | INHALATION_SOLUTION | RESPIRATORY_TRACT | Status: DC
  Administered 2023-12-23: 10 mg/h via RESPIRATORY_TRACT
  Filled 2023-12-23: qty 3

## 2023-12-23 MED ORDER — SODIUM CHLORIDE 0.9 % IV BOLUS
500.0000 mL | Freq: Once | INTRAVENOUS | Status: AC
  Administered 2023-12-23: 500 mL via INTRAVENOUS

## 2023-12-23 MED ORDER — ALBUTEROL SULFATE (2.5 MG/3ML) 0.083% IN NEBU
INHALATION_SOLUTION | RESPIRATORY_TRACT | Status: AC
  Filled 2023-12-23: qty 3

## 2023-12-23 MED ORDER — IPRATROPIUM-ALBUTEROL 0.5-2.5 (3) MG/3ML IN SOLN
3.0000 mL | Freq: Once | RESPIRATORY_TRACT | Status: AC
  Administered 2023-12-23: 3 mL via RESPIRATORY_TRACT
  Filled 2023-12-23: qty 3

## 2023-12-23 MED ORDER — GUAIFENESIN ER 600 MG PO TB12
600.0000 mg | ORAL_TABLET | Freq: Two times a day (BID) | ORAL | Status: DC
  Administered 2023-12-23 – 2023-12-25 (×4): 600 mg via ORAL
  Filled 2023-12-23 (×4): qty 1

## 2023-12-23 MED ORDER — IPRATROPIUM-ALBUTEROL 0.5-2.5 (3) MG/3ML IN SOLN
3.0000 mL | Freq: Four times a day (QID) | RESPIRATORY_TRACT | Status: DC | PRN
  Administered 2023-12-24: 3 mL via RESPIRATORY_TRACT
  Filled 2023-12-23: qty 3

## 2023-12-23 MED ORDER — ALBUTEROL SULFATE (2.5 MG/3ML) 0.083% IN NEBU
INHALATION_SOLUTION | RESPIRATORY_TRACT | Status: AC
  Filled 2023-12-23: qty 6

## 2023-12-23 MED ORDER — METHYLPREDNISOLONE SODIUM SUCC 40 MG IJ SOLR
40.0000 mg | Freq: Two times a day (BID) | INTRAMUSCULAR | Status: DC
  Administered 2023-12-24 – 2023-12-25 (×3): 40 mg via INTRAVENOUS
  Filled 2023-12-23 (×3): qty 1

## 2023-12-23 MED ORDER — ACETAMINOPHEN 325 MG PO TABS
650.0000 mg | ORAL_TABLET | Freq: Four times a day (QID) | ORAL | Status: DC | PRN
  Administered 2023-12-23 – 2023-12-25 (×5): 650 mg via ORAL
  Filled 2023-12-23 (×5): qty 2

## 2023-12-23 MED ORDER — ONDANSETRON HCL 4 MG PO TABS: 4.0000 mg | ORAL_TABLET | Freq: Four times a day (QID) | ORAL | Status: DC | PRN

## 2023-12-23 MED ORDER — HYDROCODONE BIT-HOMATROP MBR 5-1.5 MG/5ML PO SOLN
5.0000 mL | Freq: Four times a day (QID) | ORAL | Status: DC | PRN
  Administered 2023-12-23 – 2023-12-24 (×3): 5 mL via ORAL
  Filled 2023-12-23 (×3): qty 5

## 2023-12-23 MED ORDER — ACETAMINOPHEN 650 MG RE SUPP: 650.0000 mg | Freq: Four times a day (QID) | RECTAL | Status: DC | PRN

## 2023-12-23 MED ORDER — BUDESONIDE 0.25 MG/2ML IN SUSP
0.2500 mg | Freq: Two times a day (BID) | RESPIRATORY_TRACT | Status: DC
  Administered 2023-12-23 – 2023-12-25 (×5): 0.25 mg via RESPIRATORY_TRACT
  Filled 2023-12-23 (×5): qty 2

## 2023-12-23 MED ORDER — SENNOSIDES-DOCUSATE SODIUM 8.6-50 MG PO TABS: 1.0000 | ORAL_TABLET | Freq: Every evening | ORAL | Status: DC | PRN

## 2023-12-23 MED ORDER — ALBUTEROL SULFATE (2.5 MG/3ML) 0.083% IN NEBU
5.0000 mg | INHALATION_SOLUTION | Freq: Once | RESPIRATORY_TRACT | Status: AC
  Administered 2023-12-23: 5 mg via RESPIRATORY_TRACT

## 2023-12-23 MED ORDER — ENOXAPARIN SODIUM 80 MG/0.8ML IJ SOSY
80.0000 mg | PREFILLED_SYRINGE | INTRAMUSCULAR | Status: DC
  Administered 2023-12-24 – 2023-12-25 (×2): 80 mg via SUBCUTANEOUS
  Filled 2023-12-23 (×2): qty 0.8

## 2023-12-23 MED ORDER — DEXAMETHASONE SOD PHOSPHATE PF 10 MG/ML IJ SOLN
10.0000 mg | Freq: Once | INTRAMUSCULAR | Status: AC
  Administered 2023-12-23: 10 mg via INTRAVENOUS

## 2023-12-23 NOTE — ED Notes (Signed)
 Unsuccessful IV attempt LAC. Pt tolerated well. Will have another RN attempt

## 2023-12-23 NOTE — ED Provider Notes (Signed)
 [place] [facility] AT [facility] Provider Note   CSN: [id number] Arrival date & time: 12/23/23  1247     Patient presents with: Shortness of Breath   Kayla  Wilkins is a 33 y.o. female with history of Chiari malformation, obesity, presents with concern for a cough and feelings of shortness of breath that been going on since 12/13/2023.  Reports that she is coughing up a yellowish-green mucus.  Denies fever or chills at home.  Reports this feels similar to previous pneumonia.  She denies any recent surgeries or hospitalizations, no recent long plane or car rides, no history of blood clot.  She is not on any OCPs.  She denies any history of asthma or COPD.  She reports she does vape.  She has been taking a 5-day course of Augmentin at home without relief of symptoms.    Shortness of Breath      Prior to Admission medications   Medication Sig Start Date End Date Taking? Authorizing Provider  acetaminophen  (TYLENOL ) 500 MG tablet Take 500 mg by mouth every 6 (six) hours as needed.    [provider]  almotriptan (AXERT) 12.5 MG tablet Take by mouth. 05/27/22   [provider]  amphetamine-dextroamphetamine (ADDERALL) 15 MG tablet Take 1 tablet by mouth 2 (two) times daily. 09/05/21   [provider]  aspirin-acetaminophen -caffeine (EXCEDRIN MIGRAINE) 250-250-65 MG tablet Take by mouth every 6 (six) hours as needed for headache.    [provider]  brexpiprazole (REXULTI) 1 MG TABS tablet Take by mouth.    [provider]  desvenlafaxine (PRISTIQ) 50 MG 24 hr tablet Take 50 mg by mouth at bedtime.    [provider]  diazepam  (VALIUM ) 5 MG tablet Take by mouth. 05/14/22   [provider]  Ibuprofen 200 MG CAPS Take by mouth as needed.    [provider]  loratadine (CLARITIN) 10 MG tablet Take by mouth.    [provider]  metoprolol succinate (TOPROL-XL) 50 MG 24 hr tablet Take 50 mg by  mouth daily. 03/19/21   [provider]  norethindrone  (MICRONOR ) 0.35 MG tablet TAKE ONE TABLET BY MOUTH DAILY 01/25/23   Prentiss Annabella LABOR, NP    Allergies: Patient has no known allergies.    Review of Systems  Respiratory:  Positive for shortness of breath.     Updated Vital Signs BP 134/79   Pulse 96   Temp 98.4 F (36.9 C) (Oral)   Resp 15   SpO2 98%   Physical Exam Vitals and nursing note reviewed.  Constitutional:      General: She is not in acute distress.    Appearance: She is well-developed. She is obese.  HENT:     Head: Normocephalic and atraumatic.  Eyes:     Conjunctiva/sclera: Conjunctivae normal.  Cardiovascular:     Rate and Rhythm: Normal rate and regular rhythm.     Heart sounds: No murmur heard. Pulmonary:     Effort: Pulmonary effort is normal. No respiratory distress.     Comments: Upon arrival, talking in very short sentences and appears visibly short of breath.  Diminished air movement in the lungs bilaterally with wheezing also present bilaterally. Abdominal:     Palpations: Abdomen is soft.     Tenderness: There is no abdominal tenderness.  Musculoskeletal:        General: No swelling.     Cervical back: Neck supple.  Skin:    General: Skin is warm and dry.  Capillary Refill: Capillary refill takes less than 2 seconds.  Neurological:     Mental Status: She is alert.  Psychiatric:        Mood and Affect: Mood normal.     (all labs ordered are listed, but only abnormal results are displayed) Labs Reviewed  CBC WITH DIFFERENTIAL/PLATELET - Abnormal; Notable for the following components:      Result Value   WBC 12.6 (*)    Platelets 409 (*)    Eosinophils Absolute 1.0 (*)    All other components within normal limits  BASIC METABOLIC PANEL WITH GFR - Abnormal; Notable for the following components:   Glucose, Bld 107 (*)    All other components within normal limits  RESP PANEL BY RT-PCR (RSV, FLU A&B, COVID)  [id number]   RESPIRATORY PANEL BY PCR  D-DIMER, QUANTITATIVE  PRO BRAIN NATRIURETIC PEPTIDE  PREGNANCY, URINE    EKG: EKG Interpretation Date/Time:  Thursday December 23 2023 13:01:44 EST Ventricular Rate:  80 PR Interval:  142 QRS Duration:  82 QT Interval:  350 QTC Calculation: 404 R Axis:   43  Text Interpretation: Sinus or ectopic atrial rhythm Low voltage, precordial leads Anteroseptal infarct, old Baseline wander in lead(s) II III aVR aVF V4 V6 Confirmed by Yolande Charleston [phone]) on 12/23/2023 1:32:24 PM  Radiology: DG Chest 2 View Result Date: 12/23/2023 EXAM: 2 VIEW(S) XRAY OF THE CHEST 12/23/2023 02:40:00 PM COMPARISON: None available. CLINICAL HISTORY: cough FINDINGS: LUNGS AND PLEURA: No focal pulmonary opacity. No pleural effusion. No pneumothorax. HEART AND MEDIASTINUM: No acute abnormality of the cardiac and mediastinal silhouettes. BONES AND SOFT TISSUES: No acute osseous abnormality. IMPRESSION: 1. No acute cardiopulmonary process. Electronically signed by: Norman Gatlin MD 12/23/2023 03:20 PM EST RP Workstation: [id number]     Procedures   Medications Ordered in the ED  albuterol  (PROVENTIL ) (2.5 MG/3ML) 0.083% nebulizer solution (0 mg/hr Nebulization Stopped 12/23/23 1630)  albuterol  (PROVENTIL ) (2.5 MG/3ML) 0.083% nebulizer solution 5 mg (5 mg Nebulization Not Given 12/23/23 1328)  dexamethasone  (DECADRON ) injection 10 mg (10 mg Intravenous Given 12/23/23 1429)  ipratropium-albuterol  (DUONEB) 0.5-2.5 (3) MG/3ML nebulizer solution 3 mL (3 mLs Nebulization Given 12/23/23 1331)  ipratropium-albuterol  (DUONEB) 0.5-2.5 (3) MG/3ML nebulizer solution 3 mL (3 mLs Nebulization Given 12/23/23 1331)  sodium chloride  0.9 % bolus 500 mL (500 mLs Intravenous New Bag/Given 12/23/23 1433)    Clinical Course as of 12/23/23 1846  Thu Dec 23, 2023  1317 Patient with diminished lung sounds bilaterally. Mild wheezing and some crackles. [AF]  1317 Patient getting duoneb and decadron  [AF]  1513  Upon re-evaluation, patient with improved air movement but increased wheezing in lungs. Will start on continuous albuterol  treatment [AF]  1622 Re-evaluated patient, still significantly wheezing and feeling short of breath.  Requiring 2 L nasal cannula at this time, does not have any baseline oxygen requirement [AF]  1721 Patient reevaluated, still requiring 2 L nasal cannula to maintain about 95% oxygen saturation.  Still talking in short sentences and feels significantly short of breath.  Will admit patient for continued treatment. [AF]  1844 Consulted with [facility] who recommend admission [AF]    Clinical Course User Index [AF] Veta Palma, PA-C                                 Medical Decision Making Amount and/or Complexity of Data Reviewed Labs: ordered. Radiology: ordered.  Risk Prescription drug management. Decision regarding hospitalization.  Differential diagnosis includes but is not limited to asthma exacerbation, PE, COVID, flu, RSV, viral URI, strep pharyngitis, viral pharyngitis, allergic rhinitis, pneumonia, bronchitis   ED Course:  Upon initial evaluation, patient is reporting some shortness of breath and is talking in short sentences on room air.  She has diminished lung sounds bilaterally and wheezing bilaterally.  92% oxygen saturation on room air, was placed on 2L nasal cannula with improvement to 95%. Patient was started on DuoNeb treatments and given decadron   Labs Ordered: I Ordered, and personally interpreted labs.  The pertinent results include:   CBC with leukocytosis of 12.6 BMP without acute normality D-dimer within normal limits COVID, flu, RSV negative proBNP within normal limits  Imaging Studies ordered: I ordered imaging studies including chest x-ray I independently visualized the imaging with scope of interpretation limited to determining acute life threatening conditions related to emergency care. Imaging showed no acute  abnormality I agree with the radiologist interpretation   Cardiac Monitoring: / EKG: The patient was maintained on a cardiac monitor.  I personally viewed and interpreted the cardiac monitored which showed an underlying rhythm of: Alternating between normal sinus rhythm and slight tachycardia to a rate of about 105   Consultations Obtained: I requested consultation with [facility] and discussed lab and imaging findings as well as pertinent plan - they recommend: admission  Medications Given: DuoNeb Continuous albuterol  treatment Decadron  NS  Upon re-evaluation, patient still reports feeling significantly short of breath.  She still is talking in very short sentences due to her shortness of breath.  Still with significant wheezing bilaterally.  She is still requiring 2 L nasal cannula to maintain about 95% oxygen saturation.  She normally does not require oxygen at home.  Did not significantly improved with DuoNeb treatments and required continuous albuterol .  Suspect her symptoms are secondary to bronchitis. This was probably made worse by her vaping.  Patient's COVID, flu, and RSV testing is negative.  Chest x-ray without evidence of pneumonia and patient has taken Augmentin without improvement in symptoms.  Patient's D-dimer is within normal limits, I doubt PE.  BNP within normal limits, no significant lower extremity edema, no pulmonary edema on chest x-ray, doubt CHF.  Due to her ongoing shortness of breath and oxygen requirement, will reach out to hospitalist for admission.    Impression: Bronchitis Shortness of breath  Disposition:  Admission with [facility]    This chart was dictated using voice recognition software, Dragon. Despite the best efforts of this provider to proofread and correct errors, errors may still occur which can change documentation meaning.       Final diagnoses:  Bronchitis  Shortness of breath    ED Discharge Orders      None          Veta Palma, DEVONNA 12/23/23 1846    Yolande Lamar BROCKS, MD 12/24/23 2013

## 2023-12-23 NOTE — Progress Notes (Signed)
 Patient arrived to the unit from [facility]. Messaged Admissions.

## 2023-12-23 NOTE — ED Notes (Signed)
 Pl to transport via BLS truck per EDP

## 2023-12-23 NOTE — ED Triage Notes (Signed)
 C/o SHOB and cough since 11/24. Audible expiratory wheezes.

## 2023-12-23 NOTE — H&P (Signed)
 History and Physical    Kayla Wilkins  Kayla Wilkins DOB: 1990/09/03 DOA: 12/23/2023  PCP: Loris Elsie PARAS, PA-C  Patient coming from: Home  I have personally briefly reviewed patient's old medical records in [facility]  Chief Complaint: Shortness of breath, cough  HPI: Kayla  Wilkins is a 33 y.o. female with medical history significant for HTN, depression/anxiety, Chiari malformation type I, s/p sleeve gastrectomy 2016, obesity who presented to the ED for evaluation of shortness of breath and cough.  Patient reports that after Thanksgiving dinner she has had progressive and worsening shortness of breath.  She has had frequent cough mostly dry but occasionally productive of scant green sputum.  She has had chest congestion.  She reports diaphoresis but no subjective fevers.  She notes some swelling to her feet and lower extremities.  She denies any known history of pulmonary disease including asthma.  She states that she was vaping prior to her symptoms but has not used since.  She states that she recently lost her job and has been off all prescription medications for a while now.  The only medication she still has is diazepam to use as needed for anxiety, but has not had to use it for several weeks.  [facility] ED Course  Labs/Imaging on admission: I have personally reviewed following labs and imaging studies.  Initial vitals showed BP 135/67, pulse 81, RR 20, temp 98.4 F, SpO2 96% on 2 L O2 via [place].  Labs showed WBC 12.6, hemoglobin 14.5, platelets 409, sodium 140, potassium 3.5, bicarb 22, BUN 6, creatinine 0.72, serum glucose 107, D-dimer 0.43, proBNP 166.  SARS-CoV-2, influenza, RSV PCR negative.  Full respiratory viral panel in process.  Urine pregnancy ordered and pending collection.  2 view chest x-ray negative for focal consolidation, edema, effusion.  There has not been any documented hypoxia.  Patient was given IV Decadron  10 mg, 500 cc normal saline,  DuoNeb x 2, albuterol nebulizer x 2.  The hospitalist service was consulted for admission.  Review of Systems: All systems reviewed and are negative except as documented in history of present illness above.   Past Medical History:  Diagnosis Date   ADD (attention deficit disorder)    Anxiety    Bilateral ovarian cysts    Chiari malformation type I (HCC) 2017   last head CT in epic 04-19-2019   Depression    Endometrial polyp    Headache    History of COVID-19 01/2020   per pt mild to moderate symptoms that resolved   History of palpitations    had cardiology work-up done by dr b. 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    Past Surgical History:  Procedure Laterality Date   CHOLECYSTECTOMY, LAPAROSCOPIC  2009   DILATATION & CURETTAGE/HYSTEROSCOPY WITH MYOSURE N/A 09/04/2020   Procedure: DILATATION & CURETTAGE/HYSTEROSCOPY WITH MYOSURE;  Surgeon: Lavoie, Marie-Lyne, MD;  Location: [facility];  Service: Gynecology;  Laterality: N/A;   LAPAROSCOPIC GASTRIC SLEEVE RESECTION  01/14/2015   @ [facility]    Social History: Vapes.  No Known Allergies  Family History  Problem Relation Age of Onset   Varicose Veins Mother    Hypertension Mother    Depression Mother    Anxiety disorder Mother    Diabetes Paternal Aunt    Arthritis Maternal Grandmother    Alzheimer's disease Maternal Grandmother  Stroke Maternal Grandmother    Cancer Maternal Grandfather        lung   Schizophrenia Maternal Grandfather    Schizophrenia Father    Multiple sclerosis Maternal Aunt    Colon cancer Maternal Aunt      Prior to Admission medications   Medication Sig Start Date End Date Taking? Authorizing Provider  acetaminophen  (TYLENOL ) 500 MG tablet Take 500 mg by mouth every 6 (six) hours as needed.    [provider]  almotriptan (AXERT) 12.5 MG tablet Take by mouth. 05/27/22   [provider]  amphetamine-dextroamphetamine (ADDERALL) 15 MG tablet Take 1 tablet by mouth 2 (two) times daily. 09/05/21   [provider]  aspirin-acetaminophen -caffeine (EXCEDRIN MIGRAINE) 250-250-65 MG tablet Take by mouth every 6 (six) hours as needed for headache.    [provider]  brexpiprazole (REXULTI) 1 MG TABS tablet Take by mouth.    [provider]  desvenlafaxine (PRISTIQ) 50 MG 24 hr tablet Take 50 mg by mouth at bedtime.    [provider]  diazepam (VALIUM) 5 MG tablet Take by mouth. 05/14/22   [provider]  Ibuprofen 200 MG CAPS Take by mouth as needed.    [provider]  loratadine (CLARITIN) 10 MG tablet Take by mouth.    [provider]  metoprolol succinate (TOPROL-XL) 50 MG 24 hr tablet Take 50 mg by mouth daily. 03/19/21   [provider]  norethindrone  (MICRONOR ) 0.35 MG tablet TAKE ONE TABLET BY MOUTH DAILY 01/25/23   Prentiss Annabella LABOR, NP    Physical Exam: Vitals:   12/23/23 1751 12/23/23 1800 12/23/23 1953 12/23/23 2009  BP:  (!) 153/106  (!) 155/94  Pulse: 96 92  95  Resp: 15 15  18   Temp:    98.1 F (36.7 C)  TempSrc:    Oral  SpO2: 98% 99%  99%  Weight:   (!) 158 kg   Height:   5' 8 (1.727 m)    Constitutional: Obese woman resting in bed.  Appears fatigued. Eyes: EOMI, lids and conjunctivae normal ENMT: Mucous membranes are moist. Posterior pharynx clear of any exudate or lesions.Normal dentition.  Voice is soft and hoarse. Neck: normal, supple, no masses. Respiratory: Distant breath sounds, frequent cough. Normal respiratory effort. No accessory muscle use.  Cardiovascular: Regular rate and rhythm, no murmurs / rubs / gallops.  Mild nonpitting edema both lower extremities. 2+ pedal pulses. Abdomen: no tenderness Musculoskeletal: no clubbing / cyanosis. No joint deformity upper and lower extremities. Good  ROM, no contractures. Normal muscle tone.  Skin: no rashes, lesions, ulcers. No induration Neurologic: Sensation intact. Strength 5/5 in all 4.  Psychiatric: Normal judgment and insight. Alert and oriented x 3. Normal mood.   EKG: Personally reviewed. Sinus rhythm, rate 80, no acute ischemic changes.  Assessment/Plan Principal Problem:   Acute bronchitis Active Problems:   Generalized anxiety disorder   Kayla  Wilkins is a 33 y.o. female with medical history significant for HTN, depression/anxiety, Chiari malformation type I, s/p sleeve gastrectomy 2016, obesity who is admitted with acute bronchitis with bronchospasm.  Assessment and Plan: Acute bronchitis with bronchospasm: Likely viral bronchitis with associated bronchospasm.  She is transferred from [facility] on 3 L supplemental O2 via .  There has not been any documented hypoxia prior to arrival.  CXR is clear.  D-dimer and proBNP within normal limits.  COVID, influenza, RSV PCR negative. - Scheduled Brovana/Pulmicort twice daily - DuoNebs as needed - IV Solu-Medrol  40 mg twice daily - Supplemental oxygen as needed - Incentive spirometer, flutter valve, Mucinex - Follow full respiratory viral panel  Hypertension: BP is stable.  Patient states that she has been off antihypertensives for a while now after she lost her job.  Anxiety: Continue diazepam 5 mg daily as needed.   DVT prophylaxis: enoxaparin (LOVENOX) injection 40 mg Start: 12/23/23 2130 Code Status: Full code Family Communication: Discussed with patient, she has discussed with family Disposition Plan: From home and likely discharge to home pending clinical progress Consults called: None Severity of Illness: The appropriate patient status for this patient is OBSERVATION. Observation status is judged to be reasonable and necessary in order to provide the required intensity of service to ensure the patient's safety. The patient's presenting symptoms,  physical exam findings, and initial radiographic and laboratory data in the context of their medical condition is felt to place them at decreased risk for further clinical deterioration. Furthermore, it is anticipated that the patient will be medically stable for discharge from the hospital within 2 midnights of admission.   Jorie Blanch MD [facility]  If 7PM-7AM, please contact night-coverage [email or web address]  12/23/2023, 8:49 PM

## 2023-12-23 NOTE — ED Notes (Signed)
 [facility] for transport at 18:21-TC

## 2023-12-23 NOTE — Progress Notes (Signed)
 Patient stated her right 5th toenail has darkened since yesterday.

## 2023-12-23 NOTE — Hospital Course (Signed)
 Kayla  Wilkins is a 33 y.o. female with medical history significant for HTN, depression/anxiety, Chiari malformation type I, s/p sleeve gastrectomy 2016, obesity who is admitted with acute bronchitis with bronchospasm.

## 2023-12-24 ENCOUNTER — Encounter (HOSPITAL_COMMUNITY): Payer: Self-pay | Admitting: Internal Medicine

## 2023-12-24 DIAGNOSIS — F411 Generalized anxiety disorder: Secondary | ICD-10-CM

## 2023-12-24 DIAGNOSIS — J9601 Acute respiratory failure with hypoxia: Secondary | ICD-10-CM

## 2023-12-24 LAB — BASIC METABOLIC PANEL WITH GFR
Anion gap: 15 (ref 5–15)
BUN: 7 mg/dL (ref 6–20)
CO2: 20 mmol/L — ABNORMAL LOW (ref 22–32)
Calcium: 10.1 mg/dL (ref 8.9–10.3)
Chloride: 103 mmol/L (ref 98–111)
Creatinine, Ser: 0.68 mg/dL (ref 0.44–1.00)
GFR, Estimated: >60 mL/min (ref >60)
Glucose, Bld: 133 mg/dL — ABNORMAL HIGH (ref 70–99)
Potassium: 4 mmol/L (ref 3.5–5.1)
Sodium: 138 mmol/L (ref 135–145)

## 2023-12-24 LAB — RESPIRATORY PANEL BY PCR

## 2023-12-24 LAB — CBC
HCT: 41.3 % (ref 36.0–46.0)
Hemoglobin: 14.1 g/dL (ref 12.0–15.0)
MCH: 33.1 pg (ref 26.0–34.0)
MCHC: 34.1 g/dL (ref 30.0–36.0)
MCV: 96.9 fL (ref 80.0–100.0)
Platelets: 459 K/uL — ABNORMAL HIGH (ref 150–400)
RBC: 4.26 MIL/uL (ref 3.87–5.11)
RDW: 12.2 % (ref 11.5–15.5)
WBC: 12.7 K/uL — ABNORMAL HIGH (ref 4.0–10.5)
nRBC: 0 % (ref 0.0–0.2)

## 2023-12-24 LAB — PREGNANCY, URINE: Preg Test, Ur: NEGATIVE

## 2023-12-24 LAB — HIV ANTIBODY (ROUTINE TESTING W REFLEX): HIV Screen 4th Generation wRfx: NONREACTIVE

## 2023-12-24 MED ORDER — IPRATROPIUM-ALBUTEROL 0.5-2.5 (3) MG/3ML IN SOLN
3.0000 mL | Freq: Two times a day (BID) | RESPIRATORY_TRACT | Status: DC
  Administered 2023-12-24 – 2023-12-25 (×3): 3 mL via RESPIRATORY_TRACT
  Filled 2023-12-24 (×3): qty 3

## 2023-12-24 NOTE — Progress Notes (Signed)
 Progress Note   Patient: Kayla  Wilkins FMW:[id number] DOB: [birth year] DOA: 12/23/2023     0 DOS: the patient was seen and examined on 12/24/2023   Brief hospital course: The patient is a 33 yr old woman who presented to [facility] ED on 12/23/2023 with complaints of shortness of breath and cough. The patient has a past medical history significant for hypertension, depression/anxiety, chiari malformation type 1, s/p sleeve gastrectomy 2016, obesity. She does vape.  In the ED the patient was found to have acute hypoxic respiratory failure with acute bronchitis and broncho spasm.   She tested negative for influenza, RSV, and SARS-CoV-2.  Pregnancy test is negative.  She is admitted to a med/surg bed. She is receiving duonebs, solumedrol, and azithromycin.    She states that she is feeling better, but she continues to have very diminished breath sounds. She continues to require 1L O2 to maintain saturations in the upper nineties.  Assessment and Plan:  Acute hypoxic respiratory failure: The patient was admitted requiring 3L O2 in order to maintain saturations in the 90's.  Today this has improved to 1L O2 to maintain saturations in the mid to upper 90's.  Acute bronchitis with bronchospasm: Likely viral bronchitis with associated bronchospasm.  She is transferred from [facility] on 3 L supplemental O2 via [place].  There has not been any documented hypoxia prior to arrival.  CXR is clear.  D-dimer and proBNP within normal limits.  COVID, influenza, RSV PCR negative. - Scheduled Brovana /Pulmicort  twice daily - DuoNebs as needed - IV Solu-Medrol  40 mg twice daily - Supplemental oxygen as needed - Incentive spirometer, flutter valve, Mucinex  - Follow full respiratory viral panel -Patient has very poor breath sounds today.   Hypertension: BP is stable.  Patient states that she has been off antihypertensives for a while now after she lost her job.   Anxiety: Continue diazepam  5 mg daily as  needed.   DVT prophylaxis: enoxaparin  (LOVENOX ) injection 40 mg Start: 12/23/23 2130 Code Status: Full code Family Communication: Discussed with patient, she has discussed with family Disposition Plan: From home and likely discharge to home pending clinical progress Consults called: None   Subjective: The patient is very short of breath moving about in the bed as I enter. Otherwise no acute distress.   Physical Exam: Vitals:   12/24/23 0935 12/24/23 1400 12/24/23 1447 12/24/23 1455  BP: (!) 145/84   137/70  Pulse: 90   88  Resp: 20   18  Temp: 98.5 F (36.9 C)   (!) 97.4 F (36.3 C)  TempSrc:    Oral  SpO2: 96% 94% 95% 97%  Weight:      Height:       Exam:  Constitutional:  The patient is awake, alert, and oriented x 3. No acute distress. Respiratory:  No increased work of breathing. No wheezes, rales, or rhonchi No tactile fremitus Greatly diminished breath sounds bilaterally. Cardiovascular:  Regular rate and rhythm No murmurs, ectopy, or gallups. No lateral PMI. No thrills. Abdomen:  Abdomen is soft, non-tender, non-distended No hernias, masses, or organomegaly Normoactive bowel sounds.  Musculoskeletal:  No cyanosis, clubbing, or edema Skin:  No rashes, lesions, ulcers palpation of skin: no induration or nodules Neurologic:  CN 2-12 intact Sensation all 4 extremities intact Psychiatric:  Mental status Mood, affect appropriate Orientation to person, place, time  judgment and insight appear intact  Data Reviewed:  CBC, BMP  Family Communication: None available  Disposition: Status is: Inpatient Remains inpatient appropriate because:  Acute hypoxic respiratory failure.   Planned Discharge Destination: Home    Time spent: 34 minutes  Author: Hanna Aultman, DO 12/24/2023 3:28 PM  For on call review [email or web address].

## 2023-12-24 NOTE — Plan of Care (Signed)
  Problem: Coping: Goal: Level of anxiety will decrease 12/24/2023 2230 by Trudy Maus, RN Outcome: Progressing 12/24/2023 2230 by Trudy Maus, RN Outcome: Progressing   Problem: Pain Managment: Goal: General experience of comfort will improve and/or be controlled 12/24/2023 2230 by Trudy Maus, RN Outcome: Progressing 12/24/2023 2230 by Trudy Maus, RN Outcome: Progressing   Problem: Activity: Goal: Risk for activity intolerance will decrease 12/24/2023 2230 by Trudy Maus, RN Outcome: Progressing 12/24/2023 2230 by Trudy Maus, RN Outcome: Progressing

## 2023-12-25 ENCOUNTER — Other Ambulatory Visit (HOSPITAL_COMMUNITY): Payer: Self-pay

## 2023-12-25 LAB — CBC WITH DIFFERENTIAL/PLATELET
Abs Immature Granulocytes: 0.1 K/uL — ABNORMAL HIGH (ref 0.00–0.07)
Basophils Absolute: 0 K/uL (ref 0.0–0.1)
Basophils Relative: 0 %
Eosinophils Absolute: 0 K/uL (ref 0.0–0.5)
Eosinophils Relative: 0 %
HCT: 41.2 % (ref 36.0–46.0)
Hemoglobin: 13.8 g/dL (ref 12.0–15.0)
Immature Granulocytes: 1 %
Lymphocytes Relative: 12 %
Lymphs Abs: 2.1 K/uL (ref 0.7–4.0)
MCH: 33 pg (ref 26.0–34.0)
MCHC: 33.5 g/dL (ref 30.0–36.0)
MCV: 98.6 fL (ref 80.0–100.0)
Monocytes Absolute: 0.4 K/uL (ref 0.1–1.0)
Monocytes Relative: 2 %
Neutro Abs: 15.9 K/uL — ABNORMAL HIGH (ref 1.7–7.7)
Neutrophils Relative %: 85 %
Platelets: 471 K/uL — ABNORMAL HIGH (ref 150–400)
RBC: 4.18 MIL/uL (ref 3.87–5.11)
RDW: 12.6 % (ref 11.5–15.5)
WBC: 18.5 K/uL — ABNORMAL HIGH (ref 4.0–10.5)
nRBC: 0 % (ref 0.0–0.2)

## 2023-12-25 LAB — COMPREHENSIVE METABOLIC PANEL WITH GFR
ALT: 43 U/L (ref 0–44)
AST: 43 U/L — ABNORMAL HIGH (ref 15–41)
Albumin: 4.1 g/dL (ref 3.5–5.0)
Alkaline Phosphatase: 83 U/L (ref 38–126)
Anion gap: 14 (ref 5–15)
BUN: 12 mg/dL (ref 6–20)
CO2: 23 mmol/L (ref 22–32)
Calcium: 9.4 mg/dL (ref 8.9–10.3)
Chloride: 103 mmol/L (ref 98–111)
Creatinine, Ser: 0.87 mg/dL (ref 0.44–1.00)
GFR, Estimated: >60 mL/min (ref >60)
Glucose, Bld: 158 mg/dL — ABNORMAL HIGH (ref 70–99)
Potassium: 3.6 mmol/L (ref 3.5–5.1)
Sodium: 140 mmol/L (ref 135–145)
Total Bilirubin: <0.2 mg/dL (ref 0.0–1.2)
Total Protein: 7 g/dL (ref 6.5–8.1)

## 2023-12-25 MED ORDER — BUDESONIDE 90 MCG/ACT IN AEPB
1.0000 | INHALATION_SPRAY | Freq: Two times a day (BID) | RESPIRATORY_TRACT | 0 refills | Status: DC
  Filled 2023-12-25: qty 1, 30d supply, fill #0

## 2023-12-25 MED ORDER — BUDESONIDE 90 MCG/ACT IN AEPB: 1.0000 | INHALATION_SPRAY | Freq: Two times a day (BID) | RESPIRATORY_TRACT | 0 refills | Status: DC

## 2023-12-25 MED ORDER — PREDNISONE 20 MG PO TABS: ORAL_TABLET | ORAL | 0 refills | Status: DC

## 2023-12-25 MED ORDER — ALBUTEROL SULFATE HFA 108 (90 BASE) MCG/ACT IN AERS: 2.0000 | INHALATION_SPRAY | Freq: Four times a day (QID) | RESPIRATORY_TRACT | 2 refills | Status: DC | PRN

## 2023-12-25 MED ORDER — ALBUTEROL SULFATE HFA 108 (90 BASE) MCG/ACT IN AERS
2.0000 | INHALATION_SPRAY | Freq: Four times a day (QID) | RESPIRATORY_TRACT | 2 refills | Status: DC | PRN
  Filled 2023-12-25: qty 6.7, 25d supply, fill #0

## 2023-12-25 MED ORDER — AIRSUPRA 90-80 MCG/ACT IN AERO
2.0000 | INHALATION_SPRAY | Freq: Four times a day (QID) | RESPIRATORY_TRACT | 0 refills | Status: DC
  Filled 2023-12-25: qty 10.7, 25d supply, fill #0

## 2023-12-25 MED ORDER — PREDNISONE 20 MG PO TABS
ORAL_TABLET | ORAL | 0 refills | Status: AC
  Filled 2023-12-25: qty 20, 12d supply, fill #0

## 2023-12-25 MED ORDER — ALBUTEROL SULFATE HFA 108 (90 BASE) MCG/ACT IN AERS
2.0000 | INHALATION_SPRAY | Freq: Four times a day (QID) | RESPIRATORY_TRACT | 2 refills | Status: AC | PRN
  Filled 2024-01-27: qty 6.7, 25d supply, fill #0
  Filled 2024-02-09: qty 6.7, 25d supply, fill #1

## 2023-12-25 MED ORDER — BUDESONIDE 90 MCG/ACT IN AEPB
1.0000 | INHALATION_SPRAY | Freq: Two times a day (BID) | RESPIRATORY_TRACT | 0 refills | Status: AC
  Filled 2024-01-27 – 2024-02-10 (×2): qty 1, 30d supply, fill #0

## 2023-12-25 NOTE — TOC Progression Note (Addendum)
 Transition of Care [facility]) - Progression Note    Patient Details  Name: Kayla Wilkins MRN: [id number] Date of Birth: 1990/10/22  Transition of [facility]) CM/SW Contact  Sonda Manuella Quill, RN Phone Number: 12/25/2023, 4:25 PM  Clinical Narrative:    Beatris w/ pt in room; pt verified she does not have insurance; she said she afford to pay $3/prescription; pt verbalized understanding that she will not be able to obtain MATCH for 12 months;      Dear Kayla Onika  Wilkins,   You have been approved to have the prescriptions written by your discharging physician filled through our [facility] (Medication Assistance Through [facility]) program. This program allows for a one-time (no refills) 34-day supply of selected medications for a low copay amount.   The copay is $3.00 per prescription. For instance, if you have one prescription, you will pay $3.00; for two prescriptions, you pay $6.00; for three prescriptions, you pay  $9.00; and so on.   Only certain pharmacies are participating in this program with [facility] You will need to select one of the pharmacies from the attached lists and take your prescriptions, this letter, and your photo ID to one of the participating pharmacies.     We are excited that you are able to use the [facility] program to get your medications. These prescriptions must be filled within 7 days of hospital discharge or they will no longer be valid for the [facility] program. Should you have any problems with your prescriptions please contact your case management team member at [phone] for [place] Wilkins or [phone] for [place] Wilkins.   Thank you, [place]       Expected Discharge Plan and Services         Expected Discharge Date: 12/25/23                                     Social Drivers of Health (SDOH) Interventions SDOH Screenings   Food Insecurity: Food Insecurity Present (12/23/2023)  Housing: Low Risk  (12/23/2023)   Transportation Needs: No Transportation Needs (12/23/2023)  Utilities: At Risk (12/23/2023)  Depression (PHQ2-9): Low Risk  (04/25/2019)  Financial Resource Strain: Patient Declined (10/11/2023)   Received from [facility]  Social Connections: Unknown (05/24/2021)   Received from [facility]  Tobacco Use: High Risk (12/24/2023)    Readmission Risk Interventions     No data to display

## 2023-12-25 NOTE — Progress Notes (Signed)
 SATURATION QUALIFICATIONS: (This note is used to comply with regulatory documentation for home oxygen)  Patient Saturations on Room Air at Rest = 96%  Patient Saturations on Room Air while Ambulating = 94-96%  Patient Saturations on 0 Liters of oxygen while Ambulating = 94-96%

## 2023-12-25 NOTE — Progress Notes (Signed)
SATURATION QUALIFICATIONS: (This note is used to comply with regulatory documentation for home oxygen)  Patient Saturations on Room Air at Rest = 97%  Patient Saturations on Room Air while Ambulating = 94%  Patient Saturations on 0 Liters of oxygen while Ambulating = 94%

## 2023-12-25 NOTE — TOC Transition Note (Signed)
 Transition of Care [facility]) - Discharge Note   Patient Details  Name: Kayla Wilkins  Purk MRN: [id number] Date of Birth: 09-26-1990  Transition of Care (TOC) CM/SW Contact:  Lorraine LILLETTE Fenton, LCSW Phone Number: 12/25/2023, 4:30 PM   Clinical Narrative:    MATCH offered to pt as no insurance, RN Psychologist, Forensic delivered [employer] documents and pt DC home.  No  further ICM needs.      Barriers to Discharge: No Barriers Identified   Patient Goals and CMS Choice            Discharge Placement                       Discharge Plan and Services Additional resources added to the After Visit Summary for                                       Social Drivers of Health (SDOH) Interventions SDOH Screenings   Food Insecurity: Food Insecurity Present (12/23/2023)  Housing: Low Risk  (12/23/2023)  Transportation Needs: No Transportation Needs (12/23/2023)  Utilities: At Risk (12/23/2023)  Depression (PHQ2-9): Low Risk  (04/25/2019)  Financial Resource Strain: Patient Declined (10/11/2023)   Received from [facility]  Social Connections: Unknown (05/24/2021)   Received from [facility]  Tobacco Use: High Risk (12/24/2023)     Readmission Risk Interventions     No data to display

## 2023-12-25 NOTE — Plan of Care (Signed)

## 2023-12-25 NOTE — Progress Notes (Signed)
 [facility] was unable to process the patient's discharge meds through [facility] info sent at 1623. The original plan was to discharge patient home tomorrow after 1000 when [facility] was able to call the help line for [facility]. The ID number was not working. Patient updated by this RN regarding delay in discharge. Patient asked if there were any other options because she need to use her laptop at home to file for unemployment benefits by 2359 tonight otherwise she would not be able to pay her rent or bills. This RN asked if patient's significant other could bring her the info/lap top. This is not an option per patient. In order to accommodate patient's deadline for filing, the new plan is to administer neb treatments this evening by 2030 so patient can discharge home. Patient verbalized an understanding that she would need to pick up medication from [facility] on Sunday  (12/7) between 10 am & 2 pm. Patient will call first to confirm MATCH is processed. Secure chat to [facility] pharmacy updated vis secure chat, closed at 1630 today. PIV removed as noted. AVS reviewed with patient who verbalized an understanding. Primary nurse, Ale, updated in person by this RN, in agreement with the plan. MATCH letter, AVS &  financial assistance forms placed in discharge envelope.

## 2023-12-26 ENCOUNTER — Other Ambulatory Visit (HOSPITAL_COMMUNITY): Payer: Self-pay

## 2023-12-26 NOTE — Discharge Summary (Signed)
 Physician Discharge Summary   Patient: Kayla Wilkins MRN: [id number] DOB: [birth year]  Admit date:     12/23/2023  Discharge date: 12/25/2023  Discharge Physician: Brigida Bureau   PCP: Loris Elsie PARAS, PA-C   Recommendations at discharge:    Discharge to home Follow up with PCP in 7-10 days.  Discharge Diagnoses: Principal Problem:   Acute bronchitis Active Problems:   Generalized anxiety disorder  Resolved Problems:   * No resolved hospital problems. [facility] Course: The patient is a 33 yr old woman who presented to [facility] ED on 12/23/2023 with complaints of shortness of breath and cough. The patient has a past medical history significant for hypertension, depression/anxiety, chiari malformation type 1, s/p sleeve gastrectomy 2016, obesity. She does vape.   In the ED the patient was found to have acute hypoxic respiratory failure with acute bronchitis and broncho spasm.    She tested negative for influenza, RSV, and SARS-CoV-2.   Pregnancy test is negative.   She is admitted to a med/surg bed. She is receiving duonebs, solumedrol, and azithromycin.   On the day of discharge the patient is able to ambulate in the halls maintaining saturations in the 90's on room air.  She is discharged home in fair condition.    Assessment and Plan: No notes have been filed under this hospital service. Service: Hospitalist   Consultants: None Procedures performed: None  Disposition: Home Diet recommendation:  Discharge Diet Orders (From admission, onward)     Start     Ordered   12/25/23 0000  Diet - low sodium heart healthy        12/25/23 1504           Regular diet DISCHARGE MEDICATION: Allergies as of 12/25/2023   No Known Allergies      Medication List     STOP taking these medications    almotriptan 12.5 MG tablet Commonly known as: AXERT   amphetamine-dextroamphetamine 15 MG tablet Commonly known as: ADDERALL   brexpiprazole 1 MG Tabs tablet Commonly known  as: REXULTI   desvenlafaxine 50 MG 24 hr tablet Commonly known as: PRISTIQ   loratadine 10 MG tablet Commonly known as: CLARITIN   metoprolol succinate 50 MG 24 hr tablet Commonly known as: TOPROL-XL   norethindrone  0.35 MG tablet Commonly known as: MICRONOR        TAKE these medications    acetaminophen  500 MG tablet Commonly known as: TYLENOL  Take 500 mg by mouth every 6 (six) hours as needed for mild pain (pain score 1-3).   albuterol  108 (90 Base) MCG/ACT inhaler Commonly known as: VENTOLIN  HFA Inhale 2 puffs into the lungs every 6 (six) hours as needed for wheezing or shortness of breath.   aspirin-acetaminophen -caffeine 250-250-65 MG tablet Commonly known as: EXCEDRIN MIGRAINE Take by mouth every 6 (six) hours as needed for headache.   Budesonide  90 MCG/ACT inhaler Inhale 1 puff into the lungs 2 (two) times daily.   diazepam  5 MG tablet Commonly known as: VALIUM  Take 5 mg by mouth daily as needed for anxiety.   Ibuprofen 200 MG Caps Take 200 mg by mouth daily as needed (pain).   predniSONE  20 MG tablet Commonly known as: DELTASONE  Take 3 tablets (60 mg total) by mouth daily with breakfast for 3 days, THEN 2 tablets (40 mg total) daily with breakfast for 3 days, THEN 1 tablet (20 mg total) daily with breakfast for 3 days, THEN 0.5 tablets (10 mg total) daily with breakfast for 3 days. Start  taking on: December 25, 2023        Discharge Exam: Fredricka Weights   12/23/23 1953  Weight: (!) 158 kg   Exam:  Constitutional:  The patient is awake, alert, and oriented x 3. No acute distress. Respiratory:  No increased work of breathing. No wheezes, rales, or rhonchi No tactile fremitus Cardiovascular:  Regular rate and rhythm No murmurs, ectopy, or gallups. No lateral PMI. No thrills. Abdomen:  Abdomen is soft, non-tender, non-distended No hernias, masses, or organomegaly Normoactive bowel sounds.  Musculoskeletal:  No cyanosis, clubbing, or  edema Skin:  No rashes, lesions, ulcers palpation of skin: no induration or nodules Neurologic:  CN 2-12 intact Sensation all 4 extremities intact Psychiatric:  Mental status Mood, affect appropriate Orientation to person, place, time  judgment and insight appear intact   Condition at discharge: fair  The results of significant diagnostics from this hospitalization (including imaging, microbiology, ancillary and laboratory) are listed below for reference.   Imaging Studies: DG Chest 2 View Result Date: 12/23/2023 EXAM: 2 VIEW(S) XRAY OF THE CHEST 12/23/2023 02:40:00 PM COMPARISON: None available. CLINICAL HISTORY: cough FINDINGS: LUNGS AND PLEURA: No focal pulmonary opacity. No pleural effusion. No pneumothorax. HEART AND MEDIASTINUM: No acute abnormality of the cardiac and mediastinal silhouettes. BONES AND SOFT TISSUES: No acute osseous abnormality. IMPRESSION: 1. No acute cardiopulmonary process. Electronically signed by: Norman Gatlin MD 12/23/2023 03:20 PM EST RP Workstation: [id number]    Microbiology: Results for orders placed or performed during the hospital encounter of 12/23/23  Resp panel by RT-PCR (RSV, Flu A&B, Covid) Anterior Nasal Swab     Status: None   Collection Time: 12/23/23  2:19 PM   Specimen: Anterior Nasal Swab  Result Value Ref Range Status   SARS Coronavirus 2 by RT PCR NEGATIVE NEGATIVE Final    Comment: (NOTE) SARS-CoV-2 target nucleic acids are NOT DETECTED.  The SARS-CoV-2 RNA is generally detectable in upper respiratory specimens during the acute phase of infection. The lowest concentration of SARS-CoV-2 viral copies this assay can detect is 138 copies/mL. A negative result does not preclude SARS-Cov-2 infection and should not be used as the sole basis for treatment or other patient management decisions. A negative result may occur with  improper specimen collection/handling, submission of specimen other than nasopharyngeal swab, presence of  viral mutation(s) within the areas targeted by this assay, and inadequate number of viral copies(<138 copies/mL). A negative result must be combined with clinical observations, patient history, and epidemiological information. The expected result is Negative.  Fact Sheet for Patients:  bloggercourse.com  Fact Sheet for Healthcare Providers:  seriousbroker.it  This test is no t yet approved or cleared by the [place]  FDA and  has been authorized for detection and/or diagnosis of SARS-CoV-2 by FDA under an Emergency Use Authorization (EUA). This EUA will remain  in effect (meaning this test can be used) for the duration of the COVID-19 declaration under Section 564(b)(1) of the Act, 21 U.S.C.section 360bbb-3(b)(1), unless the authorization is terminated  or revoked sooner.       Influenza A by PCR NEGATIVE NEGATIVE Final   Influenza B by PCR NEGATIVE NEGATIVE Final    Comment: (NOTE) The Xpert Xpress SARS-CoV-2/FLU/RSV plus assay is intended as an aid in the diagnosis of influenza from Nasopharyngeal swab specimens and should not be used as a sole basis for treatment. Nasal washings and aspirates are unacceptable for Xpert Xpress SARS-CoV-2/FLU/RSV testing.  Fact Sheet for Patients: bloggercourse.com  Fact Sheet for Healthcare Providers: seriousbroker.it  This test is not yet approved or cleared by the [place]  FDA and has been authorized for detection and/or diagnosis of SARS-CoV-2 by FDA under an Emergency Use Authorization (EUA). This EUA will remain in effect (meaning this test can be used) for the duration of the COVID-19 declaration under Section 564(b)(1) of the Act, 21 U.S.C. section 360bbb-3(b)(1), unless the authorization is terminated or revoked.     Resp Syncytial Virus by PCR NEGATIVE NEGATIVE Final    Comment: (NOTE) Fact Sheet for  Patients: bloggercourse.com  Fact Sheet for Healthcare Providers: seriousbroker.it  This test is not yet approved or cleared by the [place]  FDA and has been authorized for detection and/or diagnosis of SARS-CoV-2 by FDA under an Emergency Use Authorization (EUA). This EUA will remain in effect (meaning this test can be used) for the duration of the COVID-19 declaration under Section 564(b)(1) of the Act, 21 U.S.C. section 360bbb-3(b)(1), unless the authorization is terminated or revoked.  Performed at [facility], [address] [place], [place] [address]   Respiratory (~20 pathogens) panel by PCR     Status: None   Collection Time: 12/23/23  6:25 PM   Specimen: Nasopharyngeal Swab; Respiratory  Result Value Ref Range Status   Adenovirus NOT DETECTED NOT DETECTED Final   Coronavirus 229E NOT DETECTED NOT DETECTED Final    Comment: (NOTE) The Coronavirus on the Respiratory Panel, DOES NOT test for the novel  Coronavirus (2019 nCoV)    Coronavirus HKU1 NOT DETECTED NOT DETECTED Final   Coronavirus NL63 NOT DETECTED NOT DETECTED Final   Coronavirus OC43 NOT DETECTED NOT DETECTED Final   Metapneumovirus NOT DETECTED NOT DETECTED Final   Rhinovirus / Enterovirus NOT DETECTED NOT DETECTED Final   Influenza A NOT DETECTED NOT DETECTED Final   Influenza B NOT DETECTED NOT DETECTED Final   Parainfluenza Virus 1 NOT DETECTED NOT DETECTED Final   Parainfluenza Virus 2 NOT DETECTED NOT DETECTED Final   Parainfluenza Virus 3 NOT DETECTED NOT DETECTED Final   Parainfluenza Virus 4 NOT DETECTED NOT DETECTED Final   Respiratory Syncytial Virus NOT DETECTED NOT DETECTED Final   Bordetella pertussis NOT DETECTED NOT DETECTED Final   Bordetella Parapertussis NOT DETECTED NOT DETECTED Final   Chlamydophila pneumoniae NOT DETECTED NOT DETECTED Final   Mycoplasma pneumoniae NOT DETECTED NOT DETECTED Final    Comment:  Performed at [facility], [address], [place], [place] [address]    Labs: CBC: Recent Labs  Lab 12/23/23 1409 12/24/23 0336 12/25/23 0344  WBC 12.6* 12.7* 18.5*  NEUTROABS 7.1  --  15.9*  HGB 14.5 14.1 13.8  HCT 41.7 41.3 41.2  MCV 95.4 96.9 98.6  PLT 409* 459* 471*   Basic Metabolic Panel: Recent Labs  Lab 12/23/23 1409 12/24/23 0336 12/25/23 0918  NA 140 138 140  K 3.5 4.0 3.6  CL 103 103 103  CO2 22 20* 23  GLUCOSE 107* 133* 158*  BUN 6 7 12   CREATININE 0.72 0.68 0.87  CALCIUM 10.0 10.1 9.4   Liver Function Tests: Recent Labs  Lab 12/25/23 0918  AST 43*  ALT 43  ALKPHOS 83  BILITOT <0.2  PROT 7.0  ALBUMIN 4.1   CBG: No results for input(s): GLUCAP in the last 168 hours.  Discharge time spent: greater than 30 minutes.  Signed: Toyia Jelinek, DO [facility] 12/26/2023

## 2024-01-27 ENCOUNTER — Other Ambulatory Visit (HOSPITAL_COMMUNITY): Payer: Self-pay

## 2024-02-09 ENCOUNTER — Other Ambulatory Visit (HOSPITAL_COMMUNITY): Payer: Self-pay

## 2024-02-09 ENCOUNTER — Emergency Department (HOSPITAL_BASED_OUTPATIENT_CLINIC_OR_DEPARTMENT_OTHER)

## 2024-02-09 ENCOUNTER — Other Ambulatory Visit: Payer: Self-pay

## 2024-02-09 ENCOUNTER — Emergency Department (HOSPITAL_BASED_OUTPATIENT_CLINIC_OR_DEPARTMENT_OTHER)
Admission: EM | Admit: 2024-02-09 | Discharge: 2024-02-10 | Disposition: A | Discharge status: Home / Self Care | Attending: Emergency Medicine | Admitting: Emergency Medicine

## 2024-02-09 DIAGNOSIS — D72829 Elevated white blood cell count, unspecified: Secondary | ICD-10-CM | POA: Insufficient documentation

## 2024-02-09 DIAGNOSIS — Z7982 Long term (current) use of aspirin: Secondary | ICD-10-CM | POA: Diagnosis not present

## 2024-02-09 DIAGNOSIS — R059 Cough, unspecified: Secondary | ICD-10-CM | POA: Insufficient documentation

## 2024-02-09 NOTE — ED Triage Notes (Addendum)
 Pt was in the hospital in Dec. For bronchitis Was prescribed inhalers and steroids And has been using her rescue inhaler too much Pt has no insurance and can not afford to refill meds Continuous coughing Hurts all over from coughing

## 2024-02-09 NOTE — ED Notes (Signed)
Patient transported to imaging at this time.

## 2024-02-09 NOTE — ED Notes (Signed)
 Patient transferred from waiting room to ED treatment room. Assuming pt care at this time.

## 2024-02-10 ENCOUNTER — Other Ambulatory Visit (HOSPITAL_COMMUNITY): Payer: Self-pay

## 2024-02-10 LAB — CBC WITH DIFFERENTIAL/PLATELET
Abs Immature Granulocytes: 0.03 K/uL (ref 0.00–0.07)
Basophils Absolute: 0.1 K/uL (ref 0.0–0.1)
Basophils Relative: 1 %
Eosinophils Absolute: 0.8 K/uL — ABNORMAL HIGH (ref 0.0–0.5)
Eosinophils Relative: 7 %
HCT: 43 % (ref 36.0–46.0)
Hemoglobin: 15 g/dL (ref 12.0–15.0)
Immature Granulocytes: 0 %
Lymphocytes Relative: 25 %
Lymphs Abs: 3.2 K/uL (ref 0.7–4.0)
MCH: 32.2 pg (ref 26.0–34.0)
MCHC: 34.9 g/dL (ref 30.0–36.0)
MCV: 92.3 fL (ref 80.0–100.0)
Monocytes Absolute: 0.9 K/uL (ref 0.1–1.0)
Monocytes Relative: 7 %
Neutro Abs: 7.6 K/uL (ref 1.7–7.7)
Neutrophils Relative %: 60 %
Platelets: 500 K/uL — ABNORMAL HIGH (ref 150–400)
RBC: 4.66 MIL/uL (ref 3.87–5.11)
RDW: 12.3 % (ref 11.5–15.5)
WBC: 12.7 K/uL — ABNORMAL HIGH (ref 4.0–10.5)
nRBC: 0 % (ref 0.0–0.2)

## 2024-02-10 LAB — BASIC METABOLIC PANEL WITH GFR
Anion gap: 13 (ref 5–15)
BUN: 13 mg/dL (ref 6–20)
CO2: 21 mmol/L — ABNORMAL LOW (ref 22–32)
Calcium: 9.7 mg/dL (ref 8.9–10.3)
Chloride: 106 mmol/L (ref 98–111)
Creatinine, Ser: 0.91 mg/dL (ref 0.44–1.00)
GFR, Estimated: >60 mL/min
Glucose, Bld: 103 mg/dL — ABNORMAL HIGH (ref 70–99)
Potassium: 4.1 mmol/L (ref 3.5–5.1)
Sodium: 141 mmol/L (ref 135–145)

## 2024-02-10 LAB — RESP PANEL BY RT-PCR (RSV, FLU A&B, COVID)  RVPGX2
Influenza A by PCR: NEGATIVE
Influenza B by PCR: NEGATIVE
Resp Syncytial Virus by PCR: NEGATIVE
SARS Coronavirus 2 by RT PCR: NEGATIVE

## 2024-02-10 MED ORDER — METHYLPREDNISOLONE SODIUM SUCC 125 MG IJ SOLR
125.0000 mg | Freq: Once | INTRAMUSCULAR | Status: AC
  Administered 2024-02-10: 125 mg via INTRAVENOUS
  Filled 2024-02-10: qty 2

## 2024-02-10 MED ORDER — MAGNESIUM SULFATE 2 GM/50ML IV SOLN
2.0000 g | Freq: Once | INTRAVENOUS | Status: AC
  Administered 2024-02-10: 2 g via INTRAVENOUS
  Filled 2024-02-10: qty 50

## 2024-02-10 MED ORDER — GUAIFENESIN-CODEINE 100-10 MG/5ML PO SOLN: 10.0000 mL | Freq: Four times a day (QID) | ORAL | 0 refills | Status: AC | PRN

## 2024-02-10 MED ORDER — PREDNISONE 10 MG PO TABS: 20.0000 mg | ORAL_TABLET | Freq: Two times a day (BID) | ORAL | 0 refills | Status: AC

## 2024-02-10 MED ORDER — ALBUTEROL SULFATE HFA 108 (90 BASE) MCG/ACT IN AERS
2.0000 | INHALATION_SPRAY | Freq: Once | RESPIRATORY_TRACT | Status: AC
  Administered 2024-02-10: 2 via RESPIRATORY_TRACT
  Filled 2024-02-10: qty 6.7

## 2024-02-10 MED ORDER — GUAIFENESIN-CODEINE 100-10 MG/5ML PO SOLN
10.0000 mL | Freq: Once | ORAL | Status: AC
  Administered 2024-02-10: 10 mL via ORAL
  Filled 2024-02-10: qty 10

## 2024-02-10 NOTE — ED Provider Notes (Signed)
 " [facility] Provider Note   CSN: [id number] Arrival date & time: 02/09/24  2309     Patient presents with: Shortness of Breath   Kayla  Wilkins is a 34 y.o. female.   Patient is a 34 year old female with history of depression, anxiety, migraines, chronic pain, morbid obesity, ADHD.  Patient presenting today with complaints of cough.  She has been having persistent cough for several weeks and is getting little relief with her albuterol  inhaler.  She was admitted the end of December for bronchospasm/bronchitis and discharged with the inhaler and a steroid which she states she is unable to afford.  She describes ongoing cough that is nonproductive.       Prior to Admission medications  Medication Sig Start Date End Date Taking? Authorizing Provider  acetaminophen  (TYLENOL ) 500 MG tablet Take 500 mg by mouth every 6 (six) hours as needed for mild pain (pain score 1-3).    [provider]  albuterol  (VENTOLIN  HFA) 108 (90 Base) MCG/ACT inhaler Inhale 2 puffs into the lungs every 6 (six) hours as needed for wheezing or shortness of breath. 12/25/23   Swayze, Ava, DO  aspirin-acetaminophen -caffeine (EXCEDRIN MIGRAINE) 250-250-65 MG tablet Take by mouth every 6 (six) hours as needed for headache.    [provider]  Budesonide  90 MCG/ACT inhaler Inhale 1 puff into the lungs 2 (two) times daily. 12/25/23   Swayze, Ava, DO  diazepam  (VALIUM ) 5 MG tablet Take 5 mg by mouth daily as needed for anxiety. 05/14/22   [provider]  Ibuprofen 200 MG CAPS Take 200 mg by mouth daily as needed (pain).    [provider]    Allergies: Patient has no known allergies.    Review of Systems  All other systems reviewed and are negative.   Updated Vital Signs BP 136/77 (BP Location: Right Arm)   Pulse (!) 102   Temp 97.8 F (36.6 C) (Oral)   Resp 20   Ht 5' 8 (1.727 m)   Wt 136.1 kg   SpO2 100%   BMI 45.61 kg/m    Physical Exam Vitals and nursing note reviewed.  Constitutional:      General: She is not in acute distress.    Appearance: She is well-developed. She is not diaphoretic.  HENT:     Head: Normocephalic and atraumatic.  Cardiovascular:     Rate and Rhythm: Normal rate and regular rhythm.     Heart sounds: No murmur heard.    No friction rub. No gallop.  Pulmonary:     Effort: Pulmonary effort is normal. No respiratory distress.     Breath sounds: Normal breath sounds. No wheezing.  Abdominal:     General: Bowel sounds are normal. There is no distension.     Palpations: Abdomen is soft.     Tenderness: There is no abdominal tenderness.  Musculoskeletal:        General: Normal range of motion.     Cervical back: Normal range of motion and neck supple.  Skin:    General: Skin is warm and dry.  Neurological:     General: No focal deficit present.     Mental Status: She is alert and oriented to person, place, and time.     (all labs ordered are listed, but only abnormal results are displayed) Labs Reviewed  RESP PANEL BY RT-PCR (RSV, FLU A&B, COVID)  [id number]  BASIC METABOLIC PANEL WITH GFR  CBC WITH DIFFERENTIAL/PLATELET  EKG: None  Radiology: DG Chest 2 View Result Date: 02/09/2024 CLINICAL DATA:  Cough EXAM: CHEST - 2 VIEW COMPARISON:  12/23/2023 FINDINGS: The heart size and mediastinal contours are within normal limits. Both lungs are clear. The visualized skeletal structures are unremarkable. IMPRESSION: No active cardiopulmonary disease. Electronically Signed   By: Luke Bun M.D.   On: 02/09/2024 23:55     Procedures   Medications Ordered in the ED  methylPREDNISolone  sodium succinate (SOLU-MEDROL ) 125 mg/2 mL injection 125 mg (has no administration in time range)  magnesium  sulfate IVPB 2 g 50 mL (has no administration in time range)  guaiFENesin -codeine  100-10 MG/5ML solution 10 mL (has no administration in time range)                                     Medical Decision Making Amount and/or Complexity of Data Reviewed Labs: ordered. Radiology: ordered.  Risk OTC drugs. Prescription drug management.   Patient with recent admission for bronchitis presenting with persistent cough despite use of her inhaler.  She arrives here with stable vital signs and is afebrile.  There is no hypoxia and lungs are basically clear.  Laboratory studies obtained including CBC and basic metabolic panel, both of which are unremarkable.  Respiratory panel negative for COVID/flu/RSV.  Chest x-ray showing no acute process.  Patient has received Solu-Medrol  and magnesium  along with a DuoNeb treatment and is feeling markedly improved.  She was also given Robitussin with codeine .  I feel as though patient can safely be discharged with prednisone  and cough medication.  To follow-up as needed.     Final diagnoses:  None    ED Discharge Orders     None          Geroldine Berg, MD 02/10/24 [phone]  "

## 2024-02-10 NOTE — Discharge Instructions (Signed)
 Continue albuterol  2 puffs every 4 hours as needed.  Begin taking prednisone  as prescribed and Robitussin with codeine  as prescribed as needed for cough.  Follow-up with primary doctor if symptoms persist.
